# Patient Record
Sex: Female | Born: 1960 | Race: White | Hispanic: No | Marital: Single | Smoking: Current every day smoker
Health system: Southern US, Community
[De-identification: ages and names within clinical notes are randomized; demographics above are authoritative.]

## PROBLEM LIST (undated history)

## (undated) DIAGNOSIS — F32A Depression, unspecified: Secondary | ICD-10-CM

## (undated) DIAGNOSIS — B009 Herpesviral infection, unspecified: Secondary | ICD-10-CM

## (undated) DIAGNOSIS — M1990 Unspecified osteoarthritis, unspecified site: Secondary | ICD-10-CM

## (undated) HISTORY — DX: Herpesviral infection, unspecified: B00.9

## (undated) HISTORY — PX: OVARIAN CYST SURGERY: SHX726

## (undated) HISTORY — DX: Depression, unspecified: F32.A

## (undated) HISTORY — PX: DIAGNOSTIC LAPAROSCOPY: SUR761

## (undated) HISTORY — DX: Unspecified osteoarthritis, unspecified site: M19.90

---

## 2010-09-14 ENCOUNTER — Emergency Department: Payer: Self-pay | Admitting: Emergency Medicine

## 2019-01-19 ENCOUNTER — Inpatient Hospital Stay
Admit: 2019-01-19 | Discharge: 2019-01-19 | Disposition: A | Discharge status: Home / Self Care | Payer: BLUE CROSS/BLUE SHIELD | Attending: Emergency Medicine

## 2019-01-19 ENCOUNTER — Emergency Department: Admit: 2019-01-19 | Payer: BLUE CROSS/BLUE SHIELD

## 2019-01-19 DIAGNOSIS — S40012A Contusion of left shoulder, initial encounter: Secondary | ICD-10-CM
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
## 2019-01-19 NOTE — ED Notes (Signed)
Pt not discharged by this writer.
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
## 2019-01-19 NOTE — ED Notes (Signed)
Pt not discharged by this Clinical research associate.
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
## 2019-07-26 ENCOUNTER — Ambulatory Visit: Payer: Self-pay | Attending: Internal Medicine

## 2019-07-26 DIAGNOSIS — Z23 Encounter for immunization: Secondary | ICD-10-CM

## 2019-07-26 NOTE — Progress Notes (Signed)
   Covid-19 Vaccination Clinic  Name:  Amanda Krause    MRN: [id number] DOB: [birth year]  07/26/2019  [name] was observed post Covid-19 immunization for 15 minutes without incident. She was provided with Vaccine Information Sheet and instruction to access the V-Safe system.   [name] was instructed to call 911 with any severe reactions post vaccine: Marland Kitchen Difficulty breathing  . Swelling of face and throat  . A fast heartbeat  . A bad rash all over body  . Dizziness and weakness   Immunizations Administered    Name Date Dose VIS Date Route   Pfizer COVID-19 Vaccine 07/26/2019  3:03 PM 0.3 mL 04/02/2019 Intramuscular   Manufacturer: [place]   Lot: [id number]   [place]: [id number]

## 2019-08-18 ENCOUNTER — Ambulatory Visit: Payer: Self-pay | Attending: Internal Medicine

## 2019-08-18 DIAGNOSIS — Z23 Encounter for immunization: Secondary | ICD-10-CM

## 2019-08-18 NOTE — Progress Notes (Signed)
   Covid-19 Vaccination Clinic  Name:  Amanda Krause    MRN: [id number] DOB: 02-Feb-1961  08/18/2019  [name] was observed post Covid-19 immunization for 15 minutes without incident. She was provided with Vaccine Information Sheet and instruction to access the V-Safe system.   [name] was instructed to call 911 with any severe reactions post vaccine: Marland Kitchen Difficulty breathing  . Swelling of face and throat  . A fast heartbeat  . A bad rash all over body  . Dizziness and weakness   Immunizations Administered    Name Date Dose VIS Date Route   Pfizer COVID-19 Vaccine 08/18/2019  3:32 PM 0.3 mL 06/16/2018 Intramuscular   Manufacturer: [place]   Lot: [id number]   [place]: [id number]

## 2021-01-31 NOTE — Progress Notes (Signed)
New Patient Office Visit  Subjective:  Patient ID: Amanda Krause, female    DOB: 12-01-1960  Age: 60 y.o. MRN: [id number]  CC:  Chief Complaint  Patient presents with   New Patient (Initial Visit)    Here to Re-establish    HPI Amanda Krause presents for new patient visit to establish care.  Introduced to Designer, jewellery role and practice setting.  All questions answered.  Discussed provider/patient relationship and expectations.  She states that since her mom had colon cancer she has been under more stress and her depression is getting worse. She was on medications in the past including topamax, lamictal, prozac, wellbutrin, zoloft, and buspar. She felt like she was doing better and could manage her symptoms with meditation and breathing exercises. However, her stress has increased recently and feels like she may need medications again.   ANXIETY/STRESS  Duration:exacerbated Anxious mood: yes  Excessive worrying: yes Irritability: yes  Sweating: no Nausea: no Palpitations:yes Hyperventilation: yes Panic attacks: yes Agoraphobia: no  Obscessions/compulsions: no Depressed mood: yes Depression screen PHQ 2/9 02/01/2021  Decreased Interest 3  Down, Depressed, Hopeless 3  PHQ - 2 Score 6  Altered sleeping 3  Tired, decreased energy 3  Change in appetite 0  Feeling bad or failure about yourself  2  Trouble concentrating 2  Moving slowly or fidgety/restless 2  Suicidal thoughts 2  PHQ-9 Score 20  Difficult doing work/chores Somewhat difficult   GAD 7 : Generalized Anxiety Score 02/01/2021  Nervous, Anxious, on Edge 2  Control/stop worrying 3  Worry too much - different things 3  Trouble relaxing 0  Restless 0  Easily annoyed or irritable 3  Afraid - awful might happen 1  Total GAD 7 Score 12  Anxiety Difficulty Somewhat difficult   Anhedonia: no Weight changes: no Insomnia: yes hard to fall asleep - chronic Hypersomnia: no Fatigue/loss of energy: yes Feelings of  worthlessness: yes Feelings of guilt: yes Impaired concentration/indecisiveness: no Suicidal ideations:  had thoughts of being better off dead, but states she would never do it and doesn't have a plan. She states it's the most selfish thing you can do to your family and friends.   Crying spells: yes Recent Stressors/Life Changes: yes   Relationship problems: no   Family stress: yes     Financial stress: no    Job stress: no    Recent death/loss: no  HERPES  Chronic, with intermittent flares. States that it started several years ago and the lesion was tested and positive for herpes. She does not remember if it was type 1 or type 2. Takes acyclovir prn and is asking for a refill. Her last outbreak was 4 months ago.   Past Medical History:  Diagnosis Date   Arthritis    Depression    Herpes     Past Surgical History:  Procedure Laterality Date   OVARIAN CYST SURGERY      Family History  Problem Relation Age of Onset   Colon cancer Mother    Heart disease Father     Social History   Socioeconomic History   Marital status: Single    Spouse name: Not on file   Number of children: Not on file   Years of education: Not on file   Highest education level: Not on file  Occupational History   Not on file  Tobacco Use   Smoking status: Every Day    Types: Cigarettes   Smokeless tobacco: Never  Vaping  Use   Vaping Use: Never used  Substance and Sexual Activity   Alcohol use: Yes    Alcohol/week: 8.0 standard drinks    Types: 8 Cans of beer per week   Drug use: Never   Sexual activity: Not Currently  Other Topics Concern   Not on file  Social History Narrative   Not on file   Social Determinants of Health   Financial Resource Strain: Not on file  Food Insecurity: Not on file  Transportation Needs: Not on file  Physical Activity: Not on file  Stress: Not on file  Social Connections: Not on file  Intimate Partner Violence: Not on file    ROS Review of Systems   Constitutional:  Positive for fatigue.  HENT: Negative.    Eyes: Negative.   Respiratory: Negative.    Cardiovascular: Negative.   Gastrointestinal: Negative.   Endocrine: Negative.   Genitourinary: Negative.   Musculoskeletal:  Positive for arthralgias (hands, chronic).  Skin: Negative.   Neurological: Negative.   Psychiatric/Behavioral:  The patient is nervous/anxious.    Objective:   Today's Vitals: BP (!) 152/80   Pulse 92   Temp 97.8 F (36.6 C) (Oral)   Ht 4' 9.09" (1.45 m)   Wt 114 lb 6.4 oz (51.9 kg)   SpO2 98%   BMI 24.68 kg/m   Physical Exam Vitals and nursing note reviewed.  Constitutional:      General: She is not in acute distress.    Appearance: Normal appearance.  HENT:     Head: Normocephalic.  Eyes:     Conjunctiva/sclera: Conjunctivae normal.  Cardiovascular:     Rate and Rhythm: Normal rate and regular rhythm.     Pulses: Normal pulses.     Heart sounds: Normal heart sounds.  Pulmonary:     Effort: Pulmonary effort is normal.     Breath sounds: Normal breath sounds.  Abdominal:     Palpations: Abdomen is soft.     Tenderness: There is no abdominal tenderness.  Musculoskeletal:     Cervical back: Normal range of motion.  Skin:    General: Skin is warm.  Neurological:     General: No focal deficit present.     Mental Status: She is alert and oriented to person, place, and time.  Psychiatric:        Mood and Affect: Mood normal.        Behavior: Behavior normal.        Thought Content: Thought content normal.        Judgment: Judgment normal.    Assessment & Plan:   Problem List Items Addressed This Visit       Other   Tobacco use    Currently smoking a little less than 1ppd for the last 9 years. She had stopped smoking for 18 years before that. She is interested in quitting today and would like to use the nicotine patch. Will send in 21mg  nicoderm patch to the pharmacy. Discussed not smoking while using the patch. Follow up in 4-6  weeks.       Herpes    No outbreaks currently, but has them intermittently. Will send in a prescription for valacyclovir prn. Follow up if having more frequent outbreaks or for any concerns.       Relevant Medications   valACYclovir (VALTREX) 1000 MG tablet   Depression, major, single episode, moderate (HCC) - Primary    Chronic, exacerbated with recent stress with mother's illness. Her PHQ-9 is 20 and  GAD 7 is 12. She has taken medications in the past, including wellbutrin, prozac, topamax, lamictal, zoloft, and buspar, however she did not find them helpful and tapered off. She had been doing well with meditation, exercise, and breathing exercises, however she would like to start medication again. Will start lexapro 10mg  daily. She has had thoughts that she may be better off dead off and on for many years. She does not have a plan and states that she would not do that to her family and friends as it is the most selfish thing someone can do. Educated and discussed that starting lexapro or any anti-depressant has the potential for suicidal ideation. Call office or 911 immediately if these symptoms develop. Follow-up in 4-6 weeks.       Relevant Medications   escitalopram (LEXAPRO) 10 MG tablet   Elevated blood pressure reading in office without diagnosis of hypertension    Repeat blood pressure in office today 157/82. She states that she has not had elevated blood pressures in the past. Discussed low salt diet and exercise. Encouraged her to check her blood pressure at home to see if readings are elevated only in the doctor's office. Follow up in 4-6 weeks.       Other Visit Diagnoses     Encounter to establish care       Encounter for screening mammogram for malignant neoplasm of breast       Mammogram ordered today   Relevant Orders   MM 3D [place] for colon cancer       Referral placed to GI for colonoscopy. Mother has history of colon cancer.   Relevant Orders    Ambulatory referral to Gastroenterology   Need for tetanus booster       Td given today   Relevant Orders   Td : Tetanus/diphtheria >7yo Preservative  free (Completed)   Need for shingles vaccine       First Shingrix vaccine given today   Relevant Orders   Varicella-zoster vaccine IM (Shingrix) (Completed)       Outpatient Encounter Medications as of 02/01/2021  Medication Sig   cholecalciferol (VITAMIN D3) 25 MCG (1000 UNIT) tablet Take 1,000 Units by mouth daily.   escitalopram (LEXAPRO) 10 MG tablet Take 1 tablet (10 mg total) by mouth daily.   nicotine (NICODERM CQ) 21 mg/24hr patch Place 1 patch (21 mg total) onto the skin daily.   valACYclovir (VALTREX) 1000 MG tablet Take 1 tablet (1,000 mg total) by mouth 2 (two) times daily as needed for up to 10 days. Take 2 tablets every 12 hours for 1 day at first sign of outbreak   vitamin B-12 (CYANOCOBALAMIN) 100 MCG tablet Take 100 mcg by mouth daily.   No facility-administered encounter medications on file as of 02/01/2021.    Follow-up: Return in about 4 weeks (around 03/01/2021) for 4-6 weeks for anxiety, depression .   Charyl Dancer, NP

## 2021-02-01 ENCOUNTER — Encounter: Payer: Self-pay | Admitting: Nurse Practitioner

## 2021-02-01 ENCOUNTER — Ambulatory Visit (INDEPENDENT_AMBULATORY_CARE_PROVIDER_SITE_OTHER): Payer: 59 | Admitting: Nurse Practitioner

## 2021-02-01 ENCOUNTER — Other Ambulatory Visit: Payer: Self-pay

## 2021-02-01 VITALS — BP 152/80 | HR 92 | Temp 97.8°F | Ht <= 58 in | Wt 114.4 lb

## 2021-02-01 DIAGNOSIS — Z7689 Persons encountering health services in other specified circumstances: Secondary | ICD-10-CM

## 2021-02-01 DIAGNOSIS — Z23 Encounter for immunization: Secondary | ICD-10-CM | POA: Diagnosis not present

## 2021-02-01 DIAGNOSIS — F321 Major depressive disorder, single episode, moderate: Secondary | ICD-10-CM | POA: Diagnosis not present

## 2021-02-01 DIAGNOSIS — B009 Herpesviral infection, unspecified: Secondary | ICD-10-CM

## 2021-02-01 DIAGNOSIS — Z1231 Encounter for screening mammogram for malignant neoplasm of breast: Secondary | ICD-10-CM

## 2021-02-01 DIAGNOSIS — R030 Elevated blood-pressure reading, without diagnosis of hypertension: Secondary | ICD-10-CM | POA: Insufficient documentation

## 2021-02-01 DIAGNOSIS — Z720 Tobacco use: Secondary | ICD-10-CM

## 2021-02-01 DIAGNOSIS — Z1211 Encounter for screening for malignant neoplasm of colon: Secondary | ICD-10-CM | POA: Diagnosis not present

## 2021-02-01 MED ORDER — ESCITALOPRAM OXALATE 10 MG PO TABS: 10.0000 mg | ORAL_TABLET | Freq: Every day | ORAL | 1 refills | Status: DC

## 2021-02-01 MED ORDER — NICOTINE 21 MG/24HR TD PT24: 21.0000 mg | MEDICATED_PATCH | Freq: Every day | TRANSDERMAL | 1 refills | Status: DC

## 2021-02-01 MED ORDER — VALACYCLOVIR HCL 1 G PO TABS: 1000.0000 mg | ORAL_TABLET | Freq: Two times a day (BID) | ORAL | 0 refills | Status: AC | PRN

## 2021-02-01 NOTE — Assessment & Plan Note (Signed)
Repeat blood pressure in office today 157/82. She states that she has not had elevated blood pressures in the past. Discussed low salt diet and exercise. Encouraged her to check her blood pressure at home to see if readings are elevated only in the doctor's office. Follow up in 4-6 weeks.

## 2021-02-01 NOTE — Assessment & Plan Note (Signed)
Currently smoking a little less than 1ppd for the last 9 years. She had stopped smoking for 18 years before that. She is interested in quitting today and would like to use the nicotine patch. Will send in 21mg  nicoderm patch to the pharmacy. Discussed not smoking while using the patch. Follow up in 4-6 weeks.

## 2021-02-01 NOTE — Assessment & Plan Note (Signed)
No outbreaks currently, but has them intermittently. Will send in a prescription for valacyclovir prn. Follow up if having more frequent outbreaks or for any concerns.

## 2021-02-01 NOTE — Assessment & Plan Note (Signed)
Chronic, exacerbated with recent stress with mother's illness. Her PHQ-9 is 20 and GAD 7 is 12. She has taken medications in the past, including wellbutrin, prozac, topamax, lamictal, zoloft, and buspar, however she did not find them helpful and tapered off. She had been doing well with meditation, exercise, and breathing exercises, however she would like to start medication again. Will start lexapro 10mg  daily. She has had thoughts that she may be better off dead off and on for many years. She does not have a plan and states that she would not do that to her family and friends as it is the most selfish thing someone can do. Educated and discussed that starting lexapro or any anti-depressant has the potential for suicidal ideation. Call office or 911 immediately if these symptoms develop. Follow-up in 4-6 weeks.

## 2021-02-01 NOTE — Patient Instructions (Signed)
[facility]  Address: [address], [place], [place] [address]  Phone: [phone]  

## 2021-02-15 ENCOUNTER — Telehealth: Payer: Self-pay

## 2021-02-15 NOTE — Telephone Encounter (Signed)
Patients referral is in the workque.

## 2021-02-15 NOTE — Telephone Encounter (Signed)
Pt. Calling to schedule colonoscopy 

## 2021-02-27 ENCOUNTER — Telehealth: Payer: Self-pay

## 2021-02-27 ENCOUNTER — Other Ambulatory Visit: Payer: Self-pay

## 2021-02-27 MED ORDER — CLENPIQ 10-3.5-12 MG-GM -GM/160ML PO SOLN: 320.0000 mL | ORAL | 0 refills | Status: DC

## 2021-02-27 NOTE — Telephone Encounter (Signed)
Gastroenterology Pre-Procedure Review  Request Date: 03/20/21 Requesting Physician: Dr. Allen Norris  PATIENT REVIEW QUESTIONS: The patient responded to the following health history questions as indicated:    1. Are you having any GI issues? no 2. Do you have a personal history of Polyps? no 3. Do you have a family history of Colon Cancer or Polyps? yes (mother & Uncle - cancer) 4. Diabetes Mellitus? No 5. Joint replacements in the past 12 months?no 6. Major health problems in the past 3 months?no 7. Any artificial heart valves, MVP, or defibrillator?no    MEDICATIONS & ALLERGIES:    Patient reports the following regarding taking any anticoagulation/antiplatelet therapy:   Plavix, Coumadin, Eliquis, Xarelto, Lovenox, Pradaxa, Brilinta, or Effient? no Aspirin? no  Patient confirms/reports the following medications:  Current Outpatient Medications  Medication Sig Dispense Refill   cholecalciferol (VITAMIN D3) 25 MCG (1000 UNIT) tablet Take 1,000 Units by mouth daily.     escitalopram (LEXAPRO) 10 MG tablet Take 1 tablet (10 mg total) by mouth daily. 30 tablet 1   nicotine (NICODERM CQ) 21 mg/24hr patch Place 1 patch (21 mg total) onto the skin daily. 28 patch 1   vitamin B-12 (CYANOCOBALAMIN) 100 MCG tablet Take 100 mcg by mouth daily.     No current facility-administered medications for this visit.    Patient confirms/reports the following allergies:  No Known Allergies  No orders of the defined types were placed in this encounter.   AUTHORIZATION INFORMATION Primary Insurance: 1D#: Group #:  Secondary Insurance: 1D#: Group #:  SCHEDULE INFORMATION: Date:  Time: Location:

## 2021-03-07 NOTE — Progress Notes (Signed)
Established Patient Office Visit  Subjective:  Patient ID: Amanda Krause, female    DOB: 1960/07/03  Age: 60 y.o. MRN: [id number]  CC:  Chief Complaint  Patient presents with   Depression    FU for depression and new med. Still very sad, anxious and depressed.    HPI Amanda Krause presents for follow up on depression and anxiety. She was recently started on lexapro and states that it helped her irritability, but she is still feeling sad and depressed. She also states that she started having urinary urgency. She states if she doesn't go to the bathroom right away, she may not make it. She is not sure if this is a side effect of the lexapro since it started when she started taking it.   DEPRESSION  Mood status: better Satisfied with current treatment?:  Lexapro helped, but not completely better Symptom severity: moderate  Duration of current treatment : months Side effects: yes Medication compliance: excellent compliance Psychotherapy/counseling: no  Previous psychiatric medications: lexapro Depressed mood: yes Anxious mood: no Anhedonia: no Significant weight loss or gain: no Insomnia: yes hard to stay asleep Fatigue: yes Feelings of worthlessness or guilt: yes Impaired concentration/indecisiveness: yes Suicidal ideations: no Hopelessness: yes Crying spells: no Depression screen [facility] 2/9 03/08/2021 02/01/2021  Decreased Interest 2 3  Down, Depressed, Hopeless 2 3  PHQ - 2 Score 4 6  Altered sleeping 2 3  Tired, decreased energy 2 3  Change in appetite 0 0  Feeling bad or failure about yourself  2 2  Trouble concentrating 1 2  Moving slowly or fidgety/restless 0 2  Suicidal thoughts 1 2  PHQ-9 Score 12 20  Difficult doing work/chores - Somewhat difficult   GAD 7 : Generalized Anxiety Score 03/08/2021 02/01/2021  Nervous, Anxious, on Edge 3 2  Control/stop worrying 2 3  Worry too much - different things 2 3  Trouble relaxing 2 0  Restless 1 0  Easily annoyed or  irritable 2 3  Afraid - awful might happen 1 1  Total GAD 7 Score 13 12  Anxiety Difficulty Somewhat difficult Somewhat difficult    Past Medical History:  Diagnosis Date   Arthritis    Depression    Herpes     Past Surgical History:  Procedure Laterality Date   OVARIAN CYST SURGERY      Family History  Problem Relation Age of Onset   Colon cancer Mother    Heart disease Father     Social History   Socioeconomic History   Marital status: Single    Spouse name: Not on file   Number of children: Not on file   Years of education: Not on file   Highest education level: Not on file  Occupational History   Not on file  Tobacco Use   Smoking status: Every Day    Types: Cigarettes   Smokeless tobacco: Never  Vaping Use   Vaping Use: Never used  Substance and Sexual Activity   Alcohol use: Yes    Alcohol/week: 8.0 standard drinks    Types: 8 Cans of beer per week   Drug use: Never   Sexual activity: Not Currently  Other Topics Concern   Not on file  Social History Narrative   Not on file   Social Determinants of Health   Financial Resource Strain: Not on file  Food Insecurity: Not on file  Transportation Needs: Not on file  Physical Activity: Not on file  Stress: Not on  file  Social Connections: Not on file  Intimate Partner Violence: Not on file    Outpatient Medications Prior to Visit  Medication Sig Dispense Refill   cholecalciferol (VITAMIN D3) 25 MCG (1000 UNIT) tablet Take 1,000 Units by mouth daily.     Sod Picosulfate-Mag Ox-Cit Acd (CLENPIQ) 10-3.5-12 MG-GM -GM/160ML SOLN Take 320 mLs by mouth as directed. 320 mL 0   vitamin B-12 (CYANOCOBALAMIN) 100 MCG tablet Take 100 mcg by mouth daily.     escitalopram (LEXAPRO) 10 MG tablet Take 1 tablet (10 mg total) by mouth daily. 30 tablet 1   nicotine (NICODERM CQ) 21 mg/24hr patch Place 1 patch (21 mg total) onto the skin daily. (Patient not taking: Reported on 03/08/2021) 28 patch 1   No  facility-administered medications prior to visit.    No Known Allergies  ROS Review of Systems  Constitutional: Negative.   Respiratory: Negative.    Cardiovascular: Negative.   Gastrointestinal: Negative.   Genitourinary: Negative.   Musculoskeletal: Negative.   Skin: Negative.   Psychiatric/Behavioral:  Positive for dysphoric mood. The patient is nervous/anxious.      Objective:    Physical Exam Vitals and nursing note reviewed.  Constitutional:      General: She is not in acute distress.    Appearance: Normal appearance.  HENT:     Head: Normocephalic and atraumatic.  Eyes:     Conjunctiva/sclera: Conjunctivae normal.  Cardiovascular:     Rate and Rhythm: Normal rate and regular rhythm.     Pulses: Normal pulses.     Heart sounds: Normal heart sounds.  Pulmonary:     Effort: Pulmonary effort is normal.     Breath sounds: Normal breath sounds.  Musculoskeletal:     Cervical back: Normal range of motion.  Skin:    General: Skin is warm and dry.  Neurological:     General: No focal deficit present.     Mental Status: She is alert and oriented to person, place, and time.  Psychiatric:        Mood and Affect: Mood normal.        Behavior: Behavior normal.        Thought Content: Thought content normal.        Judgment: Judgment normal.    BP (!) 142/85   Pulse 73   Temp 98.4 F (36.9 C) (Oral)   Ht 4' 10"  (1.473 m)   Wt 115 lb (52.2 kg)   SpO2 97%   BMI 24.04 kg/m  Wt Readings from Last 3 Encounters:  03/08/21 115 lb (52.2 kg)  02/01/21 114 lb 6.4 oz (51.9 kg)     Health Maintenance Due  Topic Date Due   Pneumococcal Vaccine 44-58 Years old (1 - PCV) Never done   HIV Screening  Never done   Hepatitis C Screening  Never done   PAP SMEAR-Modifier  Never done   COLONOSCOPY (Pts 45-72yr Insurance coverage will need to be confirmed)  Never done   MAMMOGRAM  Never done   COVID-19 Vaccine (3 - [place]oster for [place]) 10/13/2019   INFLUENZA VACCINE   Never done    There are no preventive care reminders to display for this patient.  No results found for: TSH No results found for: WBC, HGB, HCT, MCV, PLT No results found for: NA, K, CHLORIDE, CO2, GLUCOSE, BUN, CREATININE, BILITOT, ALKPHOS, AST, ALT, PROT, ALBUMIN, CALCIUM, ANIONGAP, EGFR, GFR No results found for: CHOL No results found for: HDL No results found for:  [place] No results found for: TRIG No results found for: CHOLHDL No results found for: HGBA1C    Assessment & Plan:   Problem List Items Addressed This Visit       Other   Depression, major, single episode, moderate ([place]) - Primary    Chronic, not controlled. Symptoms have improved since starting lexapro, however may be experiencing some side effects. Will have her wean off lexapro and start seroquel at bedtime. Currently denies SI/HI. Follow up in 4-6 weeks.       Elevated blood pressure reading in office without diagnosis of hypertension    Blood pressure improved from last visit, however still elevated. She is not interested in starting any medications today. Discussed DASH diet. Encouraged her to get a blood pressure cuff and monitor her blood pressure at home.        Meds ordered this encounter  Medications   QUEtiapine (SEROQUEL) 25 MG tablet    Sig: Take 1 tablet (25 mg total) by mouth at bedtime.    Dispense:  30 tablet    Refill:  1     Follow-up: Return in about 4 weeks (around 04/05/2021) for physical.    Charyl Dancer, NP

## 2021-03-08 ENCOUNTER — Encounter: Payer: Self-pay | Admitting: Nurse Practitioner

## 2021-03-08 ENCOUNTER — Ambulatory Visit (INDEPENDENT_AMBULATORY_CARE_PROVIDER_SITE_OTHER): Payer: 59 | Admitting: Nurse Practitioner

## 2021-03-08 ENCOUNTER — Other Ambulatory Visit: Payer: Self-pay

## 2021-03-08 VITALS — BP 142/85 | HR 73 | Temp 98.4°F | Ht <= 58 in | Wt 115.0 lb

## 2021-03-08 DIAGNOSIS — F321 Major depressive disorder, single episode, moderate: Secondary | ICD-10-CM | POA: Diagnosis not present

## 2021-03-08 DIAGNOSIS — R030 Elevated blood-pressure reading, without diagnosis of hypertension: Secondary | ICD-10-CM

## 2021-03-08 MED ORDER — QUETIAPINE FUMARATE 25 MG PO TABS: 25.0000 mg | ORAL_TABLET | Freq: Every day | ORAL | 1 refills | Status: DC

## 2021-03-08 NOTE — Patient Instructions (Addendum)
Take 1 lexapro tablet tomorrow, and then 1 on Sunday, and then stop Start seroquel 1 tablet at bedtime every night starting tonight Watch the amount of salt you are eating in your foods  Get a blood pressure cuff and start checking your blood pressure daily and write it down

## 2021-03-08 NOTE — Assessment & Plan Note (Signed)
Chronic, not controlled. Symptoms have improved since starting lexapro, however may be experiencing some side effects. Will have her wean off lexapro and start seroquel at bedtime. Currently denies SI/HI. Follow up in 4-6 weeks.

## 2021-03-08 NOTE — Assessment & Plan Note (Signed)
Blood pressure improved from last visit, however still elevated. She is not interested in starting any medications today. Discussed DASH diet. Encouraged her to get a blood pressure cuff and monitor her blood pressure at home.

## 2021-03-12 ENCOUNTER — Encounter: Payer: Self-pay | Admitting: Nurse Practitioner

## 2021-03-12 MED ORDER — ARIPIPRAZOLE 2 MG PO TABS: 2.0000 mg | ORAL_TABLET | Freq: Every day | ORAL | 1 refills | Status: DC

## 2021-03-12 MED ORDER — ESCITALOPRAM OXALATE 10 MG PO TABS: 10.0000 mg | ORAL_TABLET | Freq: Every day | ORAL | 1 refills | Status: DC

## 2021-03-19 ENCOUNTER — Telehealth: Payer: Self-pay | Admitting: Gastroenterology

## 2021-03-19 ENCOUNTER — Encounter: Payer: Self-pay | Admitting: *Deleted

## 2021-03-19 NOTE — Telephone Encounter (Signed)
LVM for patient to inform that her insurance denied full coverage..Patient returned call and stated that she was already contacted by [facility] and has a $2800 deductible and will set up payment arrangements with [facility]. I did inform patient that she could contact her referring Doctor to get referred to a Free standing [place], so the insurance could cover the procedure. Patient wanted to continue with the scheduled procedure and set up payment arrangement with [facility].

## 2021-03-20 ENCOUNTER — Ambulatory Visit: Payer: 59 | Admitting: Registered Nurse

## 2021-03-20 ENCOUNTER — Encounter
Admission: RE | Disposition: A | Discharge status: Home / Self Care | Payer: Self-pay | Source: Ambulatory Visit | Attending: Gastroenterology

## 2021-03-20 ENCOUNTER — Other Ambulatory Visit: Payer: Self-pay

## 2021-03-20 ENCOUNTER — Ambulatory Visit
Admission: RE | Admit: 2021-03-20 | Discharge: 2021-03-20 | Disposition: A | Discharge status: Home / Self Care | Payer: 59 | Source: Ambulatory Visit | Attending: Gastroenterology | Admitting: Gastroenterology

## 2021-03-20 ENCOUNTER — Encounter: Payer: Self-pay | Admitting: Gastroenterology

## 2021-03-20 DIAGNOSIS — F17210 Nicotine dependence, cigarettes, uncomplicated: Secondary | ICD-10-CM | POA: Diagnosis not present

## 2021-03-20 DIAGNOSIS — D124 Benign neoplasm of descending colon: Secondary | ICD-10-CM | POA: Insufficient documentation

## 2021-03-20 DIAGNOSIS — Z1211 Encounter for screening for malignant neoplasm of colon: Secondary | ICD-10-CM | POA: Diagnosis present

## 2021-03-20 DIAGNOSIS — D123 Benign neoplasm of transverse colon: Secondary | ICD-10-CM | POA: Diagnosis not present

## 2021-03-20 DIAGNOSIS — D125 Benign neoplasm of sigmoid colon: Secondary | ICD-10-CM | POA: Insufficient documentation

## 2021-03-20 DIAGNOSIS — K635 Polyp of colon: Secondary | ICD-10-CM | POA: Diagnosis not present

## 2021-03-20 HISTORY — PX: COLONOSCOPY: SHX5424

## 2021-03-20 SURGERY — COLONOSCOPY
Anesthesia: General

## 2021-03-20 MED ORDER — PROPOFOL 500 MG/50ML IV EMUL
INTRAVENOUS | Status: AC
  Filled 2021-03-20: qty 50

## 2021-03-20 MED ORDER — SODIUM CHLORIDE 0.9 % IV SOLN: INTRAVENOUS | Status: DC

## 2021-03-20 MED ORDER — PROPOFOL 500 MG/50ML IV EMUL
INTRAVENOUS | Status: DC | PRN
  Administered 2021-03-20: 150 ug/kg/min via INTRAVENOUS

## 2021-03-20 MED ORDER — PROPOFOL 10 MG/ML IV BOLUS
INTRAVENOUS | Status: DC | PRN
  Administered 2021-03-20 (×3): 10 mg via INTRAVENOUS
  Administered 2021-03-20: 80 mg via INTRAVENOUS

## 2021-03-20 MED ORDER — DEXMEDETOMIDINE (PRECEDEX) IN NS 20 MCG/5ML (4 MCG/ML) IV SYRINGE
PREFILLED_SYRINGE | INTRAVENOUS | Status: DC | PRN
  Administered 2021-03-20: 12 ug via INTRAVENOUS
  Administered 2021-03-20: 8 ug via INTRAVENOUS

## 2021-03-20 MED ORDER — LIDOCAINE HCL (CARDIAC) PF 100 MG/5ML IV SOSY
PREFILLED_SYRINGE | INTRAVENOUS | Status: DC | PRN
  Administered 2021-03-20: 40 mg via INTRAVENOUS

## 2021-03-20 NOTE — Transfer of Care (Signed)
Immediate Anesthesia Transfer of Care Note  Patient: Amanda Krause  Procedure(s) Performed: Procedure(s): COLONOSCOPY (N/A)  Patient Location: PACU and Endoscopy Unit  Anesthesia Type:General  Level of Consciousness: sedated  Airway & Oxygen Therapy: Patient Spontanous Breathing and Patient connected to nasal cannula oxygen  Post-op Assessment: Report given to RN and Post -op Vital signs reviewed and stable  Post vital signs: Reviewed and stable  Last Vitals:  Vitals:   03/20/21 1029 03/20/21 1031  BP: 120/69 120/69  Pulse: 71 77  Resp: 15 17  Temp: 36.6 C   SpO2: 93% 968%    Complications: No apparent anesthesia complications

## 2021-03-20 NOTE — Anesthesia Postprocedure Evaluation (Signed)
Anesthesia Post Note  Patient: Amanda Krause  Procedure(s) Performed: COLONOSCOPY  Patient location during evaluation: PACU Anesthesia Type: General Level of consciousness: awake and alert Pain management: pain level controlled Vital Signs Assessment: post-procedure vital signs reviewed and stable Respiratory status: spontaneous breathing, nonlabored ventilation, respiratory function stable and patient connected to nasal cannula oxygen Cardiovascular status: blood pressure returned to baseline and stable Postop Assessment: no apparent nausea or vomiting Anesthetic complications: no   No notable events documented.   Last Vitals:  Vitals:   03/20/21 1031 03/20/21 1049  BP: 120/69 (!) 143/74  Pulse: 77   Resp: 17   Temp:    SpO2: 100% 99%    Last Pain:  Vitals:   03/20/21 1049  TempSrc:   PainSc: 0-No pain                 Nisa Decaire M Calena [place]

## 2021-03-20 NOTE — Anesthesia Procedure Notes (Signed)
Date/Time: 03/20/2021 10:04 AM Performed by: Doreen Salvage, CRNA Pre-anesthesia Checklist: Patient identified, Emergency Drugs available, Suction available and Patient being monitored Patient Re-evaluated:Patient Re-evaluated prior to induction Oxygen Delivery Method: Nasal cannula Induction Type: IV induction Dental Injury: Teeth and Oropharynx as per pre-operative assessment  Comments: Nasal cannula with etCO2 monitoring

## 2021-03-20 NOTE — Anesthesia Preprocedure Evaluation (Signed)
Anesthesia Evaluation  Patient identified by MRN, date of birth, ID band Patient awake    Reviewed: Allergy & Precautions, H&P , NPO status , Patient's Chart, lab work & pertinent test results, reviewed documented beta blocker date and time   Airway Mallampati: II   Neck ROM: full    Dental  (+) Poor Dentition   Pulmonary neg pulmonary ROS, Current Smoker,    Pulmonary exam normal        Cardiovascular Exercise Tolerance: Good negative cardio ROS Normal cardiovascular exam Rhythm:regular Rate:Normal     Neuro/Psych PSYCHIATRIC DISORDERS Depression negative neurological ROS     GI/Hepatic negative GI ROS, Neg liver ROS,   Endo/Other  negative endocrine ROS  Renal/GU negative Renal ROS  negative genitourinary   Musculoskeletal   Abdominal   Peds  Hematology negative hematology ROS (+)   Anesthesia Other Findings Past Medical History: No date: Arthritis No date: Depression No date: Herpes Past Surgical History: No date: DIAGNOSTIC LAPAROSCOPY No date: OVARIAN CYST SURGERY BMI    Body Mass Index: 24.89 kg/m     Reproductive/Obstetrics negative OB ROS                             Anesthesia Physical Anesthesia Plan  ASA: 2  Anesthesia Plan: General   Post-op Pain Management:    Induction:   PONV Risk Score and Plan:   Airway Management Planned:   Additional Equipment:   Intra-op Plan:   Post-operative Plan:   Informed Consent: I have reviewed the patients History and Physical, chart, labs and discussed the procedure including the risks, benefits and alternatives for the proposed anesthesia with the patient or authorized representative who has indicated his/her understanding and acceptance.     Dental Advisory Given  Plan Discussed with: CRNA  Anesthesia Plan Comments:         Anesthesia Quick Evaluation

## 2021-03-20 NOTE — Op Note (Signed)
[facility] Gastroenterology Patient Name: Amanda Krause Procedure Date: 03/20/2021 10:04 AM MRN: [id number] Account #: [id number] Date of Birth: 1960/12/18 Admit Type: Outpatient Age: 60 Room: [facility] ENDO ROOM 4 Gender: Female Note Status: Finalized Instrument Name: Jasper Riling [id number] Procedure:             Colonoscopy Indications:           Screening for colorectal malignant neoplasm Providers:             Lucilla Lame MD, MD Referring MD:          Guadalupe Maple, MD (Referring MD) Medicines:             Propofol per Anesthesia Complications:         No immediate complications. Procedure:             Pre-Anesthesia Assessment:                        - Prior to the procedure, a History and Physical was                         performed, and patient medications and allergies were                         reviewed. The patient's tolerance of previous                         anesthesia was also reviewed. The risks and benefits                         of the procedure and the sedation options and risks                         were discussed with the patient. All questions were                         answered, and informed consent was obtained. Prior                         Anticoagulants: The patient has taken no previous                         anticoagulant or antiplatelet agents. ASA Grade                         Assessment: II - A patient with mild systemic disease.                         After reviewing the risks and benefits, the patient                         was deemed in satisfactory condition to undergo the                         procedure.                        After obtaining informed consent, the colonoscope was  passed under direct vision. Throughout the procedure,                         the patient's blood pressure, pulse, and oxygen                         saturations were monitored continuously. The                          Colonoscope was introduced through the anus and                         advanced to the the cecum, identified by appendiceal                         orifice and ileocecal valve. The colonoscopy was                         performed without difficulty. The patient tolerated                         the procedure well. The quality of the bowel                         preparation was excellent. Findings:      The perianal and digital rectal examinations were normal.      Two sessile polyps were found in the transverse colon. The polyps were 3       to 4 mm in size. These polyps were removed with a cold snare. Resection       and retrieval were complete.      A 4 mm polyp was found in the descending colon. The polyp was sessile.       The polyp was removed with a cold snare. Resection and retrieval were       complete.      A 9 mm polyp was found in the sigmoid colon. The polyp was pedunculated.       The polyp was removed with a hot snare. Resection and retrieval were       complete. Impression:            - Two 3 to 4 mm polyps in the transverse colon,                         removed with a cold snare. Resected and retrieved.                        - One 4 mm polyp in the descending colon, removed with                         a cold snare. Resected and retrieved.                        - One 9 mm polyp in the sigmoid colon, removed with a                         hot snare. Resected and retrieved. Recommendation:        - Discharge patient to home.                        -  Resume previous diet.                        - Continue present medications.                        - Await pathology results.                        - If the pathology report reveals adenomatous tissue,                         then repeat the colonoscopy for surveillance in 5                         years. Procedure Code(s):     --- Professional ---                        [phone], Colonoscopy, flexible; with removal of                          tumor(s), polyp(s), or other lesion(s) by snare                         technique Diagnosis Code(s):     --- Professional ---                        Z12.11, Encounter for screening for malignant neoplasm                         of colon                        K63.5, Polyp of colon CPT copyright 2019 [employer] Association. All rights reserved. The codes documented in this report are preliminary and upon coder review may  be revised to meet current compliance requirements. Lucilla Lame MD, MD 03/20/2021 10:26:42 AM This report has been signed electronically. Number of Addenda: 0 Note Initiated On: 03/20/2021 10:04 AM Scope Withdrawal Time: 0 hours 10 minutes 16 seconds  Total Procedure Duration: 0 hours 15 minutes 52 seconds  Estimated Blood Loss:  Estimated blood loss: none.      [facility]

## 2021-03-20 NOTE — H&P (Signed)
Lucilla Lame, MD [address] [place], [place] [address] Phone: [phone] Fax : [phone]  Primary Care Physician:  Amanda Dancer, NP Primary Gastroenterologist:  Dr. Allen Norris  Pre-Procedure History & Physical: HPI:  Amanda Krause is a 60 y.o. female is here for a screening colonoscopy.   Past Medical History:  Diagnosis Date   Arthritis    Depression    Herpes     Past Surgical History:  Procedure Laterality Date   DIAGNOSTIC LAPAROSCOPY     OVARIAN CYST SURGERY      Prior to Admission medications   Medication Sig Start Date End Date Taking? Authorizing Provider  ARIPiprazole (ABILIFY) 2 MG tablet Take 1 tablet (2 mg total) by mouth daily. 03/12/21  Yes McElwee, Lauren A, NP  cholecalciferol (VITAMIN D3) 25 MCG (1000 UNIT) tablet Take 1,000 Units by mouth daily.   Yes [provider]  escitalopram (LEXAPRO) 10 MG tablet Take 1 tablet (10 mg total) by mouth daily. 03/12/21  Yes McElwee, Lauren A, NP  vitamin B-12 (CYANOCOBALAMIN) 100 MCG tablet Take 100 mcg by mouth daily.   Yes [provider]  nicotine (NICODERM CQ) 21 mg/24hr patch Place 1 patch (21 mg total) onto the skin daily. Patient not taking: Reported on 03/08/2021 02/01/21   Amanda Dancer, NP  Sod Picosulfate-Mag Ox-Cit Acd (CLENPIQ) 10-3.5-12 MG-GM -GM/160ML SOLN Take 320 mLs by mouth as directed. 02/27/21   Lucilla Lame, MD    Allergies as of 02/27/2021   (No Known Allergies)    Family History  Problem Relation Age of Onset   Colon cancer Mother    Heart disease Father     Social History   Socioeconomic History   Marital status: Single    Spouse name: Not on file   Number of children: Not on file   Years of education: Not on file   Highest education level: Not on file  Occupational History   Not on file  Tobacco Use   Smoking status: Every Day    Types: Cigarettes   Smokeless tobacco: Never  Vaping Use   Vaping Use: Never used  Substance and Sexual  Activity   Alcohol use: Yes    Alcohol/week: 8.0 standard drinks    Types: 8 Cans of beer per week    Comment: Every night   Drug use: Never   Sexual activity: Not Currently  Other Topics Concern   Not on file  Social History Narrative   Not on file   Social Determinants of Health   Financial Resource Strain: Not on file  Food Insecurity: Not on file  Transportation Needs: Not on file  Physical Activity: Not on file  Stress: Not on file  Social Connections: Not on file  Intimate Partner Violence: Not on file    Review of Systems: See HPI, otherwise negative ROS  Physical Exam: BP (!) 170/78   Pulse 77   Temp 97.9 F (36.6 C) (Temporal)   Resp 14   Ht 4\' 9"  (1.448 m)   Wt 52.2 kg   SpO2 100%   BMI 24.89 kg/m  General:   Alert,  pleasant and cooperative in NAD Head:  Normocephalic and atraumatic. Neck:  Supple; no masses or thyromegaly. Lungs:  Clear throughout to auscultation.    Heart:  Regular rate and rhythm. Abdomen:  Soft, nontender and nondistended. Normal bowel sounds, without guarding, and without rebound.   Neurologic:  Alert and  oriented x4;  grossly normal neurologically.  Impression/Plan: Amanda Krause is now here to undergo a screening colonoscopy.  Risks, benefits, and alternatives regarding colonoscopy have been reviewed with the patient.  Questions have been answered.  All parties agreeable.

## 2021-03-21 ENCOUNTER — Encounter: Payer: Self-pay | Admitting: Gastroenterology

## 2021-03-21 LAB — SURGICAL PATHOLOGY

## 2021-03-22 ENCOUNTER — Encounter: Payer: Self-pay | Admitting: Gastroenterology

## 2021-04-05 ENCOUNTER — Encounter: Payer: 59 | Admitting: Nurse Practitioner

## 2021-04-05 DIAGNOSIS — F321 Major depressive disorder, single episode, moderate: Secondary | ICD-10-CM

## 2021-04-05 DIAGNOSIS — Z13220 Encounter for screening for lipoid disorders: Secondary | ICD-10-CM

## 2021-04-05 DIAGNOSIS — R030 Elevated blood-pressure reading, without diagnosis of hypertension: Secondary | ICD-10-CM

## 2021-04-05 DIAGNOSIS — Z114 Encounter for screening for human immunodeficiency virus [HIV]: Secondary | ICD-10-CM

## 2021-04-05 DIAGNOSIS — Z1159 Encounter for screening for other viral diseases: Secondary | ICD-10-CM

## 2021-04-05 DIAGNOSIS — Z0000 Encounter for general adult medical examination without abnormal findings: Secondary | ICD-10-CM

## 2021-04-05 DIAGNOSIS — Z720 Tobacco use: Secondary | ICD-10-CM

## 2021-04-09 ENCOUNTER — Other Ambulatory Visit: Payer: Self-pay | Admitting: Nurse Practitioner

## 2021-04-09 NOTE — Telephone Encounter (Signed)
Requested Prescriptions  Pending Prescriptions Disp Refills   escitalopram (LEXAPRO) 10 MG tablet [Pharmacy Med Name: ESCITALOPRAM 10MG  TABLETS] 30 tablet 0    Sig: TAKE 1 TABLET BY MOUTH EVERY DAY     Psychiatry:  Antidepressants - SSRI Passed - 04/09/2021 11:02 AM      Passed - Completed PHQ-2 or PHQ-9 in the last 360 days      Passed - Valid encounter within last 6 months    Recent Outpatient Visits          1 month ago Depression, major, single episode, moderate ([place])   [place], Lauren A, NP   2 months ago Depression, major, single episode, moderate ([place])   [place] McElwee, Scheryl Darter, NP

## 2021-04-17 ENCOUNTER — Other Ambulatory Visit: Payer: Self-pay | Admitting: Nurse Practitioner

## 2021-04-18 NOTE — Telephone Encounter (Signed)
Requested medications are due for refill today.  Might be too soon  Requested medications are on the active medications list.  yes  Last refill. 03/12/2021, #30 with 1 refill  Future visit scheduled.   no  Notes to clinic.  Medication not delegated.    Requested Prescriptions  Pending Prescriptions Disp Refills   ARIPiprazole (ABILIFY) 2 MG tablet [Pharmacy Med Name: ARIPIPRAZOLE 2MG  TABLETS] 30 tablet 1    Sig: TAKE 1 TABLET(2 MG) BY MOUTH DAILY     Not Delegated - Psychiatry:  Antipsychotics - Second Generation (Atypical) - aripiprazole Failed - 04/17/2021  9:57 AM      Failed - This refill cannot be delegated      Passed - Valid encounter within last 6 months    Recent Outpatient Visits           1 month ago Depression, major, single episode, moderate ([place])   [place], Lauren A, NP   2 months ago Depression, major, single episode, moderate ([place])   [place] McElwee, Scheryl Darter, NP

## 2021-05-09 ENCOUNTER — Other Ambulatory Visit: Payer: Self-pay

## 2021-05-09 ENCOUNTER — Other Ambulatory Visit: Payer: Self-pay | Admitting: Nurse Practitioner

## 2021-05-09 ENCOUNTER — Ambulatory Visit
Admission: RE | Admit: 2021-05-09 | Discharge: 2021-05-09 | Disposition: A | Discharge status: Home / Self Care | Payer: 59 | Source: Ambulatory Visit | Attending: Nurse Practitioner | Admitting: Nurse Practitioner

## 2021-05-09 DIAGNOSIS — R928 Other abnormal and inconclusive findings on diagnostic imaging of breast: Secondary | ICD-10-CM

## 2021-05-09 DIAGNOSIS — Z1231 Encounter for screening mammogram for malignant neoplasm of breast: Secondary | ICD-10-CM | POA: Diagnosis present

## 2021-05-09 DIAGNOSIS — N6489 Other specified disorders of breast: Secondary | ICD-10-CM

## 2021-05-10 NOTE — Progress Notes (Signed)
Please let patient know that her mammogram was abnormal. There was asymmetry found in the right breast. The radiologist recommends she have a diagnostic mammogram and ultrasound. The orders have already been placed. Please make sure she has this scheduled.

## 2021-05-21 ENCOUNTER — Encounter: Payer: 59 | Admitting: Nurse Practitioner

## 2021-05-21 NOTE — Progress Notes (Signed)
BP (!) 141/83 (BP Location: Left Arm, Cuff Size: Normal)    Pulse 69    Temp 98.2 F (36.8 C) (Oral)    Ht 4\' 10"  (1.473 m)    Wt 121 lb 3.2 oz (55 kg)    SpO2 97%    BMI 25.33 kg/m    Subjective:    Patient ID: Amanda Krause, female    DOB: June 12, 1960, 61 y.o.   MRN: [id number]  HPI: Amanda Krause is a 61 y.o. female presenting on 05/22/2021 for comprehensive medical examination. Current medical complaints include:none  She currently lives with: Menopausal Symptoms: no  HYPERTENSION Hypertension status: uncontrolled  Satisfied with current treatment? no Duration of hypertension: years BP monitoring frequency:  daily BP range: 150/90 BP medication side effects:  no Medication compliance: excellent compliance Previous BP meds:none Aspirin: no Recurrent headaches: no Visual changes: no Palpitations: no Dyspnea: no Chest pain: no Lower extremity edema: no Dizzy/lightheaded: no   DEPRESSION Patient states the Lexapro is working well.  She does not like the Abilify.  It is causing her to gain weight.  Her mother is sick and battling cancer which adds to her stress.  Depression Screen done today and results listed below:  Depression screen [facility] 2/9 05/22/2021 03/08/2021 02/01/2021  Decreased Interest 2 2 3   Down, Depressed, Hopeless 2 2 3   PHQ - 2 Score 4 4 6   Altered sleeping 1 2 3   Tired, decreased energy 2 2 3   Change in appetite 2 0 0  Feeling bad or failure about yourself  2 2 2   Trouble concentrating 1 1 2   Moving slowly or fidgety/restless 1 0 2  Suicidal thoughts 1 1 2   PHQ-9 Score 14 12 20   Difficult doing work/chores - - Somewhat difficult    The patient does not have a history of falls. I did complete a risk assessment for falls. A plan of care for falls was documented.   Past Medical History:  Past Medical History:  Diagnosis Date   Arthritis    Depression    Herpes     Surgical History:  Past Surgical History:  Procedure Laterality Date   COLONOSCOPY  N/A 03/20/2021   Procedure: COLONOSCOPY;  Surgeon: Lucilla Lame, MD;  [facility] ENDOSCOPY;  Service: Endoscopy;  Laterality: N/A;   DIAGNOSTIC LAPAROSCOPY     OVARIAN CYST SURGERY      Medications:  Current Outpatient Medications on File Prior to Visit  Medication Sig   cholecalciferol (VITAMIN D3) 25 MCG (1000 UNIT) tablet Take 1,000 Units by mouth daily.   nicotine (NICODERM CQ) 21 mg/24hr patch Place 1 patch (21 mg total) onto the skin daily.   valACYclovir (VALTREX) 1000 MG tablet Take 1,000 mg by mouth 2 (two) times daily.   vitamin B-12 (CYANOCOBALAMIN) 100 MCG tablet Take 100 mcg by mouth daily.   Sod Picosulfate-Mag Ox-Cit Acd (CLENPIQ) 10-3.5-12 MG-GM -GM/160ML SOLN Take 320 mLs by mouth as directed. (Patient not taking: Reported on 05/22/2021)   No current facility-administered medications on file prior to visit.    Allergies:  No Known Allergies  Social History:  Social History   Socioeconomic History   Marital status: Single    Spouse name: Not on file   Number of children: Not on file   Years of education: Not on file   Highest education level: Not on file  Occupational History   Not on file  Tobacco Use   Smoking status: Every Day    Types: Cigarettes  Smokeless tobacco: Never  Vaping Use   Vaping Use: Never used  Substance and Sexual Activity   Alcohol use: Yes    Alcohol/week: 8.0 standard drinks    Types: 8 Cans of beer per week    Comment: Every night   Drug use: Never   Sexual activity: Not Currently  Other Topics Concern   Not on file  Social History Narrative   Not on file   Social Determinants of Health   Financial Resource Strain: Not on file  Food Insecurity: Not on file  Transportation Needs: Not on file  Physical Activity: Not on file  Stress: Not on file  Social Connections: Not on file  Intimate Partner Violence: Not on file   Social History   Tobacco Use  Smoking Status Every Day   Types: Cigarettes  Smokeless Tobacco  Never   Social History   Substance and Sexual Activity  Alcohol Use Yes   Alcohol/week: 8.0 standard drinks   Types: 8 Cans of beer per week   Comment: Every night    Family History:  Family History  Problem Relation Age of Onset   Colon cancer Mother    Heart disease Father    Breast cancer Neg Hx     Past medical history, surgical history, medications, allergies, family history and social history reviewed with patient today and changes made to appropriate areas of the chart.   Review of Systems  Eyes:  Negative for blurred vision and double vision.  Respiratory:  Negative for shortness of breath.   Cardiovascular:  Negative for chest pain, palpitations and leg swelling.  Neurological:  Negative for dizziness and headaches.  Psychiatric/Behavioral:  Positive for depression. The patient is nervous/anxious.   All other ROS negative except what is listed above and in the HPI.      Objective:    BP (!) 141/83 (BP Location: Left Arm, Cuff Size: Normal)    Pulse 69    Temp 98.2 F (36.8 C) (Oral)    Ht 4\' 10"  (1.473 m)    Wt 121 lb 3.2 oz (55 kg)    SpO2 97%    BMI 25.33 kg/m   Wt Readings from Last 3 Encounters:  05/22/21 121 lb 3.2 oz (55 kg)  03/20/21 115 lb (52.2 kg)  03/08/21 115 lb (52.2 kg)    Physical Exam Vitals and nursing note reviewed. Exam conducted with a chaperone present [facility] [place], [place]).  Constitutional:      General: She is awake. She is not in acute distress.    Appearance: She is well-developed. She is not ill-appearing.  HENT:     Head: Normocephalic and atraumatic.     Right Ear: Hearing, tympanic membrane, ear canal and external ear normal. No drainage.     Left Ear: Hearing, tympanic membrane, ear canal and external ear normal. No drainage.     Nose: Nose normal.     Right Sinus: No maxillary sinus tenderness or frontal sinus tenderness.     Left Sinus: No maxillary sinus tenderness or frontal sinus tenderness.     Mouth/Throat:     Mouth:  Mucous membranes are moist.     Pharynx: Oropharynx is clear. Uvula midline. No pharyngeal swelling, oropharyngeal exudate or posterior oropharyngeal erythema.  Eyes:     General: Lids are normal.        Right eye: No discharge.        Left eye: No discharge.     Extraocular Movements: Extraocular movements intact.  Conjunctiva/sclera: Conjunctivae normal.     Pupils: Pupils are equal, round, and reactive to light.     Visual Fields: Right eye visual fields normal and left eye visual fields normal.  Neck:     Thyroid: No thyromegaly.     Vascular: No carotid bruit.     Trachea: Trachea normal.  Cardiovascular:     Rate and Rhythm: Normal rate and regular rhythm.     Heart sounds: Normal heart sounds. No murmur heard.   No gallop.  Pulmonary:     Effort: Pulmonary effort is normal. No accessory muscle usage or respiratory distress.     Breath sounds: Normal breath sounds.  Chest:  Breasts:    Right: Normal.     Left: Normal.  Abdominal:     General: Bowel sounds are normal.     Palpations: Abdomen is soft. There is no hepatomegaly or splenomegaly.     Tenderness: There is no abdominal tenderness.  Genitourinary:    Vagina: Normal.     Cervix: Normal.     Adnexa: Right adnexa normal and left adnexa normal.  Musculoskeletal:        General: Normal range of motion.     Cervical back: Normal range of motion and neck supple.     Right lower leg: No edema.     Left lower leg: No edema.  Lymphadenopathy:     Head:     Right side of head: No submental, submandibular, tonsillar, preauricular or posterior auricular adenopathy.     Left side of head: No submental, submandibular, tonsillar, preauricular or posterior auricular adenopathy.     Cervical: No cervical adenopathy.     Upper Body:     Right upper body: No supraclavicular, axillary or pectoral adenopathy.     Left upper body: No supraclavicular, axillary or pectoral adenopathy.  Skin:    General: Skin is warm and dry.      Capillary Refill: Capillary refill takes less than 2 seconds.     Findings: No rash.  Neurological:     Mental Status: She is alert and oriented to person, place, and time.     Gait: Gait is intact.     Deep Tendon Reflexes: Reflexes are normal and symmetric.     Reflex Scores:      Brachioradialis reflexes are 2+ on the right side and 2+ on the left side.      Patellar reflexes are 2+ on the right side and 2+ on the left side. Psychiatric:        Attention and Perception: Attention normal.        Mood and Affect: Mood normal.        Speech: Speech normal.        Behavior: Behavior normal. Behavior is cooperative.        Thought Content: Thought content normal.        Judgment: Judgment normal.    Results for orders placed or performed during the hospital encounter of 03/20/21  Surgical pathology  Result Value Ref Range   SURGICAL PATHOLOGY      SURGICAL PATHOLOGY CASE: [id number] PATIENT: Kristianne Vanderweele Surgical Pathology Report     Specimen Submitted: A. Colon polyp x2, transverse; cold snare B. Colon polyp, descending; cold snare C. Colon polyp, sigmoid; hot snare  Clinical History: Colon cancer screening and family history of colon cancer.  Colon polyps      DIAGNOSIS: A. COLON POLYP X2, TRANSVERSE; COLD SNARE: - TUBULAR ADENOMA, TWO FRAGMENTS. -  INTRAMUCOSAL LYMPHOID AGGREGATES, TWO FRAGMENTS. - NEGATIVE FOR HIGH GRADE DYSPLASIA AND MALIGNANCY.  B. COLON POLYP, DESCENDING; COLD SNARE: - TUBULAR ADENOMA. - NEGATIVE FOR HIGH GRADE DYSPLASIA AND MALIGNANCY.  C. COLON POLYP, SIGMOID; HOT SNARE: - TUBULAR ADENOMA. - NEGATIVE FOR HIGH GRADE DYSPLASIA AND MALIGNANCY.   GROSS DESCRIPTION: A. Labeled: Transverse colon polyp cold snare x2 Received: Formalin Collection time: 10:12 AM on 03/20/2021 Placed into formalin time: 10:12 AM on 03/20/2021 Tissue fragment(s): Multiple Size: Aggregate, 2. 5 x 0.3 x 0.2 cm Description: Received is a single fragment of  tan-pink soft tissue admixed with intestinal debris.  The ratio of soft tissue to intestinal debris is 20: 80. Entirely submitted in 1 cassette.  B. Labeled: Descending colon polyp cold snare Received: Formalin Collection time: 10:16 AM on 03/20/2021 Placed into formalin time: 10:16 AM on 03/20/2021 Tissue fragment(s): Multiple Size: Aggregate, 0.4 x 0.3 x 0.2 cm Description: Received is a single fragment of tan soft tissue admixed with intestinal debris.  The ratio of soft tissue to intestinal debris is 90: 10. Entirely submitted in 1 cassette.  C. Labeled: Sigmoid colon polyp hot snare Received: Formalin Collection time: 10:21 AM on 03/20/2021 Placed into formalin time: 10:21 AM on 03/20/2021 Tissue fragment(s): 1 Size: 0.9 x 0.8 x 0.3 cm Description: Received is a single fragment of red lobulated soft tissue. There is a resection margin which is inked green.  The specimen is trisected. Entirely  submitted in 1 cassette.  RB 03/20/2021  Final Diagnosis performed by Betsy Pries, MD.   Electronically signed 03/21/2021 10:40:31AM The electronic signature indicates that the named Attending Pathologist has evaluated the specimen Technical component performed at [facility], [address], [place], [place] [address] Lab: [phone] Dir: Rush Farmer, MD, MMM  Professional component performed at [facility], [facility], [place], [place], [place] [address] Lab: [phone] Dir: Kathi Simpers, MD       Assessment & Plan:   Problem List Items Addressed This Visit       Cardiovascular and Mediastinum   Primary hypertension    Chronic. Not well controlled.  Will start Lisinopril 10mg  daily. Side effects and benefits of medication discussed during visit. Recommend keeping log over the next month and bring to next visit.  Follow up in 1 month for reevaluation.      Relevant Medications   lisinopril (ZESTRIL) 10 MG tablet     Other   Depression,  major, single episode, moderate (HCC)    Chronic.  Not well controlled.  Will increase Lexapro from 10mg  to 20mg .  Will stop Abilify.  Discussed how to wean down off Abilify.  Labs ordered today.  Return to clinic in 1 months for reevaluation.  Call sooner if concerns arise.        Relevant Medications   escitalopram (LEXAPRO) 20 MG tablet   Other Visit Diagnoses     Annual physical exam    -  Primary   Health maintenance reviewed during visit. Labs ordered today. FLu shot given. Will give pneumonia shot at next vist. Up to date on mammo and cologuard.   Relevant Orders   CBC with Differential/Platelet   Comprehensive metabolic panel   Lipid panel   TSH   Urinalysis, Routine w reflex microscopic   Cytology - PAP   Encounter for hepatitis C screening test for low risk patient       Relevant Orders   Hepatitis C Antibody   Screening for HIV (human immunodeficiency virus)  Relevant Medications   valACYclovir (VALTREX) 1000 MG tablet   Other Relevant Orders   HIV Antibody (routine testing w rflx)   Need for influenza vaccination       Relevant Orders   Flu Vaccine QUAD 6+ mos PF IM (Fluarix Quad PF) (Completed)        Follow up plan: Return in about 1 month (around 06/19/2021) for BP Check.   LABORATORY TESTING:  - Pap smear: pap done  IMMUNIZATIONS:   - Tdap: Tetanus vaccination status reviewed: last tetanus booster within 10 years. - Influenza: Administered today - Pneumovax:  will get at next visit - Prevnar: Not applicable - COVID: Up to date - HPV: Not applicable - Shingrix vaccine: Up to date  SCREENING: -Mammogram: Up to date  - Colonoscopy: Up to date  - Bone Density: Not applicable  -Hearing Test: Not applicable  -Spirometry: Not applicable   PATIENT COUNSELING:   Advised to take 1 mg of folate supplement per day if capable of pregnancy.   Sexuality: Discussed sexually transmitted diseases, partner selection, use of condoms, avoidance of unintended  pregnancy  and contraceptive alternatives.   Advised to avoid cigarette smoking.  I discussed with the patient that most people either abstain from alcohol or drink within safe limits (<=14/week and <=4 drinks/occasion for males, <=7/weeks and <= 3 drinks/occasion for females) and that the risk for alcohol disorders and other health effects rises proportionally with the number of drinks per week and how often a drinker exceeds daily limits.  Discussed cessation/primary prevention of drug use and availability of treatment for abuse.   Diet: Encouraged to adjust caloric intake to maintain  or achieve ideal body weight, to reduce intake of dietary saturated fat and total fat, to limit sodium intake by avoiding high sodium foods and not adding table salt, and to maintain adequate dietary potassium and calcium preferably from fresh fruits, vegetables, and low-fat dairy products.    stressed the importance of regular exercise  Injury prevention: Discussed safety belts, safety helmets, smoke detector, smoking near bedding or upholstery.   Dental health: Discussed importance of regular tooth brushing, flossing, and dental visits.    NEXT PREVENTATIVE PHYSICAL DUE IN 1 YEAR. Return in about 1 month (around 06/19/2021) for BP Check.

## 2021-05-22 ENCOUNTER — Other Ambulatory Visit: Payer: Self-pay

## 2021-05-22 ENCOUNTER — Ambulatory Visit (INDEPENDENT_AMBULATORY_CARE_PROVIDER_SITE_OTHER): Payer: 59 | Admitting: Nurse Practitioner

## 2021-05-22 ENCOUNTER — Other Ambulatory Visit (HOSPITAL_COMMUNITY)
Admission: RE | Admit: 2021-05-22 | Discharge: 2021-05-22 | Disposition: A | Discharge status: Home / Self Care | Payer: 59 | Source: Ambulatory Visit | Attending: Nurse Practitioner | Admitting: Nurse Practitioner

## 2021-05-22 ENCOUNTER — Encounter: Payer: Self-pay | Admitting: Nurse Practitioner

## 2021-05-22 ENCOUNTER — Ambulatory Visit
Admission: RE | Admit: 2021-05-22 | Discharge: 2021-05-22 | Disposition: A | Discharge status: Home / Self Care | Payer: 59 | Source: Ambulatory Visit | Attending: Nurse Practitioner | Admitting: Nurse Practitioner

## 2021-05-22 VITALS — BP 141/83 | HR 69 | Temp 98.2°F | Ht <= 58 in | Wt 121.2 lb

## 2021-05-22 DIAGNOSIS — Z114 Encounter for screening for human immunodeficiency virus [HIV]: Secondary | ICD-10-CM

## 2021-05-22 DIAGNOSIS — R928 Other abnormal and inconclusive findings on diagnostic imaging of breast: Secondary | ICD-10-CM | POA: Insufficient documentation

## 2021-05-22 DIAGNOSIS — Z0000 Encounter for general adult medical examination without abnormal findings: Secondary | ICD-10-CM | POA: Insufficient documentation

## 2021-05-22 DIAGNOSIS — N6489 Other specified disorders of breast: Secondary | ICD-10-CM | POA: Diagnosis present

## 2021-05-22 DIAGNOSIS — Z1159 Encounter for screening for other viral diseases: Secondary | ICD-10-CM | POA: Diagnosis not present

## 2021-05-22 DIAGNOSIS — R030 Elevated blood-pressure reading, without diagnosis of hypertension: Secondary | ICD-10-CM

## 2021-05-22 DIAGNOSIS — F321 Major depressive disorder, single episode, moderate: Secondary | ICD-10-CM

## 2021-05-22 DIAGNOSIS — Z23 Encounter for immunization: Secondary | ICD-10-CM | POA: Diagnosis not present

## 2021-05-22 DIAGNOSIS — R768 Other specified abnormal immunological findings in serum: Secondary | ICD-10-CM

## 2021-05-22 DIAGNOSIS — I10 Essential (primary) hypertension: Secondary | ICD-10-CM | POA: Diagnosis not present

## 2021-05-22 LAB — URINALYSIS, ROUTINE W REFLEX MICROSCOPIC
Bilirubin, UA: NEGATIVE
Glucose, UA: NEGATIVE
Ketones, UA: NEGATIVE
Leukocytes,UA: NEGATIVE
Nitrite, UA: NEGATIVE
Protein,UA: NEGATIVE
RBC, UA: NEGATIVE
Specific Gravity, UA: 1.01 (ref 1.005–1.030)
Urobilinogen, Ur: 0.2 mg/dL (ref 0.2–1.0)
pH, UA: 6 (ref 5.0–7.5)

## 2021-05-22 MED ORDER — LISINOPRIL 10 MG PO TABS: 10.0000 mg | ORAL_TABLET | Freq: Every day | ORAL | 0 refills | Status: DC

## 2021-05-22 MED ORDER — ESCITALOPRAM OXALATE 20 MG PO TABS: 20.0000 mg | ORAL_TABLET | Freq: Every day | ORAL | 1 refills | Status: DC

## 2021-05-22 NOTE — Assessment & Plan Note (Signed)
Chronic. Not well controlled.  Will start Lisinopril 10mg  daily. Side effects and benefits of medication discussed during visit. Recommend keeping log over the next month and bring to next visit.  Follow up in 1 month for reevaluation.

## 2021-05-22 NOTE — Assessment & Plan Note (Signed)
Chronic.  Not well controlled.  Will increase Lexapro from 10mg  to 20mg .  Will stop Abilify.  Discussed how to wean down off Abilify.  Labs ordered today.  Return to clinic in 1 months for reevaluation.  Call sooner if concerns arise.

## 2021-05-23 LAB — COMPREHENSIVE METABOLIC PANEL WITH GFR
ALT: 259 [IU]/L — ABNORMAL HIGH (ref 0–32)
AST: 190 [IU]/L — ABNORMAL HIGH (ref 0–40)
Albumin/Globulin Ratio: 1.6 (ref 1.2–2.2)
Albumin: 4.4 g/dL (ref 3.8–4.8)
Alkaline Phosphatase: 93 [IU]/L (ref 44–121)
BUN/Creatinine Ratio: 19 (ref 12–28)
BUN: 12 mg/dL (ref 8–27)
Bilirubin Total: 0.5 mg/dL (ref 0.0–1.2)
CO2: 24 mmol/L (ref 20–29)
Calcium: 9.3 mg/dL (ref 8.7–10.3)
Chloride: 100 mmol/L (ref 96–106)
Creatinine, Ser: 0.64 mg/dL (ref 0.57–1.00)
Globulin, Total: 2.7 g/dL (ref 1.5–4.5)
Glucose: 101 mg/dL — ABNORMAL HIGH (ref 70–99)
Potassium: 4.2 mmol/L (ref 3.5–5.2)
Sodium: 139 mmol/L (ref 134–144)
Total Protein: 7.1 g/dL (ref 6.0–8.5)
eGFR: 100 mL/min/{1.73_m2}

## 2021-05-23 LAB — LIPID PANEL
Chol/HDL Ratio: 2.1 ratio (ref 0.0–4.4)
Cholesterol, Total: 130 mg/dL (ref 100–199)
HDL: 62 mg/dL (ref >39)
LDL Chol Calc (NIH): 55 mg/dL (ref 0–99)
Triglycerides: 61 mg/dL (ref 0–149)
VLDL Cholesterol Cal: 13 mg/dL (ref 5–40)

## 2021-05-23 LAB — CBC WITH DIFFERENTIAL/PLATELET
Basophils Absolute: 0.1 10*3/uL (ref 0.0–0.2)
Basos: 1 %
EOS (ABSOLUTE): 0.1 10*3/uL (ref 0.0–0.4)
Eos: 1 %
Hematocrit: 47.2 % — ABNORMAL HIGH (ref 34.0–46.6)
Hemoglobin: 16.6 g/dL — ABNORMAL HIGH (ref 11.1–15.9)
Immature Grans (Abs): 0 10*3/uL (ref 0.0–0.1)
Immature Granulocytes: 0 %
Lymphocytes Absolute: 2.2 10*3/uL (ref 0.7–3.1)
Lymphs: 41 %
MCH: 33.1 pg — ABNORMAL HIGH (ref 26.6–33.0)
MCHC: 35.2 g/dL (ref 31.5–35.7)
MCV: 94 fL (ref 79–97)
Monocytes Absolute: 0.4 10*3/uL (ref 0.1–0.9)
Monocytes: 8 %
Neutrophils Absolute: 2.6 10*3/uL (ref 1.4–7.0)
Neutrophils: 49 %
Platelets: 189 10*3/uL (ref 150–450)
RBC: 5.01 x10E6/uL (ref 3.77–5.28)
RDW: 11.2 % — ABNORMAL LOW (ref 11.7–15.4)
WBC: 5.4 10*3/uL (ref 3.4–10.8)

## 2021-05-23 LAB — HEPATITIS C ANTIBODY: Hep C Virus Ab: >11 s/co ratio — ABNORMAL HIGH (ref 0.0–0.9)

## 2021-05-23 LAB — TSH: TSH: 0.999 u[IU]/mL (ref 0.450–4.500)

## 2021-05-23 LAB — HIV ANTIBODY (ROUTINE TESTING W REFLEX): HIV Screen 4th Generation wRfx: NONREACTIVE

## 2021-05-23 NOTE — Addendum Note (Signed)
Addended by: Jon Billings on: 05/23/2021 10:37 AM   Modules accepted: Orders

## 2021-05-23 NOTE — Progress Notes (Signed)
Please let patient know her Mammogram did not show any evidence of a malignancy.  The recommendation is to repeat the Mammogram in 1 year.  

## 2021-05-23 NOTE — Progress Notes (Signed)
Please call patient and let her know that her lab work shows that her hepatitis C screening was positive.  Her liver enzymes are also elevated this is likely due to the hepatitis C infection.  I would like her to come in and do further lab work to determine more about the Hepatitis C.  I have also ordered an ultrasound of her liver due to the liver enzymes being elevated and placed a referral for her to see GI.  Please let me know if she has further questions.  Please make patient a lab appt.

## 2021-05-24 ENCOUNTER — Telehealth: Payer: Self-pay

## 2021-05-24 LAB — CYTOLOGY - PAP: Diagnosis: NEGATIVE

## 2021-05-24 NOTE — Progress Notes (Signed)
HI Amanda Krause. Your PAP was normal.  We will repeat it in 3 years.  Please let me know if you have any questions.

## 2021-05-24 NOTE — Telephone Encounter (Signed)
CALLED PATIENT NO ANSWER LEFT VOICEMAIL FOR A CALL BACK ? ?

## 2021-05-25 ENCOUNTER — Other Ambulatory Visit: Payer: Self-pay

## 2021-05-25 ENCOUNTER — Other Ambulatory Visit: Payer: 59

## 2021-05-25 ENCOUNTER — Telehealth: Payer: Self-pay

## 2021-05-25 DIAGNOSIS — R768 Other specified abnormal immunological findings in serum: Secondary | ICD-10-CM

## 2021-05-25 NOTE — Telephone Encounter (Signed)
SCHEDULED FOR 07/08/2021

## 2021-05-30 LAB — HCV GENOTYPE REFLEX NS5A: Hepatitis C Genotype: 3

## 2021-05-30 LAB — HCV RNA QUANT
HCV log10: 6.695 log10 IU/mL
Hepatitis C Quantitation: 4960000 IU/mL

## 2021-05-30 LAB — REFLEX TEST INFORMATION

## 2021-05-30 NOTE — Progress Notes (Signed)
Please let patient know that her lab work shows that she does need to be treated for Hepatitis C.  I recommend she keep her appt for March with the GI doctor.

## 2021-06-04 ENCOUNTER — Other Ambulatory Visit: Payer: Self-pay

## 2021-06-04 ENCOUNTER — Ambulatory Visit
Admission: RE | Admit: 2021-06-04 | Discharge: 2021-06-04 | Disposition: A | Discharge status: Home / Self Care | Payer: 59 | Source: Ambulatory Visit | Attending: Nurse Practitioner | Admitting: Nurse Practitioner

## 2021-06-04 DIAGNOSIS — R768 Other specified abnormal immunological findings in serum: Secondary | ICD-10-CM | POA: Insufficient documentation

## 2021-06-04 NOTE — Progress Notes (Signed)
Please let patient know that her US of the liver was unremarkable. Make sure to keep the appt with GI for March 15.

## 2021-06-20 ENCOUNTER — Other Ambulatory Visit: Payer: Self-pay

## 2021-06-20 ENCOUNTER — Encounter: Payer: Self-pay | Admitting: Nurse Practitioner

## 2021-06-20 ENCOUNTER — Ambulatory Visit (INDEPENDENT_AMBULATORY_CARE_PROVIDER_SITE_OTHER): Payer: 59 | Admitting: Nurse Practitioner

## 2021-06-20 VITALS — BP 127/73 | HR 79 | Temp 98.4°F | Wt 119.4 lb

## 2021-06-20 DIAGNOSIS — I10 Essential (primary) hypertension: Secondary | ICD-10-CM

## 2021-06-20 MED ORDER — LISINOPRIL 10 MG PO TABS: 10.0000 mg | ORAL_TABLET | Freq: Every day | ORAL | 1 refills | Status: DC

## 2021-06-20 NOTE — Progress Notes (Signed)
? ?BP 127/73 Comment: home reading  Pulse 79   Temp 98.4 ?F (36.9 ?C) (Oral)   Wt 119 lb 6.4 oz (54.2 kg)   SpO2 98%   BMI 24.95 kg/m?   ? ?Subjective:  ? ? Patient ID: Amanda Krause, female    DOB: 11/05/1960, 61 y.o.   MRN: [id number] ? ?HPI: ?Amanda Krause is a 61 y.o. female ? ?Chief Complaint  ?Patient presents with  ? Hypertension  ?  1 month f/up   ? ?HYPERTENSION ?Hypertension status: controlled  ?Satisfied with current treatment? no ?Duration of hypertension: years ?BP monitoring frequency:  daily ?BP range: 120/70 ?BP medication side effects:  no ?Medication compliance: excellent compliance ?Previous BP meds:lisinopril ?Aspirin: no ?Recurrent headaches: no ?Visual changes: no ?Palpitations: no ?Dyspnea: no ?Chest pain: no ?Lower extremity edema: no ?Dizzy/lightheaded: sometimes ? ?Relevant past medical, surgical, family and social history reviewed and updated as indicated. Interim medical history since our last visit reviewed. ?Allergies and medications reviewed and updated. ? ?Review of Systems  ?Eyes:  Negative for visual disturbance.  ?Respiratory:  Negative for cough, chest tightness and shortness of breath.   ?Cardiovascular:  Negative for chest pain, palpitations and leg swelling.  ?Neurological:  Negative for dizziness and headaches.  ? ?Per HPI unless specifically indicated above ? ?   ?Objective:  ?  ?BP 127/73 Comment: home reading  Pulse 79   Temp 98.4 ?F (36.9 ?C) (Oral)   Wt 119 lb 6.4 oz (54.2 kg)   SpO2 98%   BMI 24.95 kg/m?   ?Wt Readings from Last 3 Encounters:  ?06/20/21 119 lb 6.4 oz (54.2 kg)  ?05/22/21 121 lb 3.2 oz (55 kg)  ?03/20/21 115 lb (52.2 kg)  ?  ?Physical Exam ?Vitals and nursing note reviewed.  ?Constitutional:   ?   General: She is not in acute distress. ?   Appearance: Normal appearance. She is normal weight. She is not ill-appearing, toxic-appearing or diaphoretic.  ?HENT:  ?   Head: Normocephalic.  ?   Right Ear: External ear normal.  ?   Left Ear: External ear  normal.  ?   Nose: Nose normal.  ?   Mouth/Throat:  ?   Mouth: Mucous membranes are moist.  ?   Pharynx: Oropharynx is clear.  ?Eyes:  ?   General:     ?   Right eye: No discharge.     ?   Left eye: No discharge.  ?   Extraocular Movements: Extraocular movements intact.  ?   Conjunctiva/sclera: Conjunctivae normal.  ?   Pupils: Pupils are equal, round, and reactive to light.  ?Cardiovascular:  ?   Rate and Rhythm: Normal rate and regular rhythm.  ?   Heart sounds: No murmur heard. ?Pulmonary:  ?   Effort: Pulmonary effort is normal. No respiratory distress.  ?   Breath sounds: Normal breath sounds. No wheezing or rales.  ?Musculoskeletal:  ?   Cervical back: Normal range of motion and neck supple.  ?Skin: ?   General: Skin is warm and dry.  ?   Capillary Refill: Capillary refill takes less than 2 seconds.  ?Neurological:  ?   General: No focal deficit present.  ?   Mental Status: She is alert and oriented to person, place, and time. Mental status is at baseline.  ?Psychiatric:     ?   Mood and Affect: Mood normal.     ?   Behavior: Behavior normal.     ?  Thought Content: Thought content normal.     ?   Judgment: Judgment normal.  ? ? ?Results for orders placed or performed in visit on 05/25/21  ?HCV Genotype Reflex NS5A  ?Result Value Ref Range  ? Hepatitis C Genotype 3   ? Please note: Comment   ?HCV RNA quant  ?Result Value Ref Range  ? Hepatitis C Quantitation 4,960,000 IU/mL  ? HCV log10 6.695 log10 IU/mL  ? Test Information Comment   ?Reflex Test Information  ?Result Value Ref Range  ? Reflex Test Information Comment   ? ?   ?Assessment & Plan:  ? ?Problem List Items Addressed This Visit   ? ?  ? Cardiovascular and Mediastinum  ? Primary hypertension - Primary  ?  Chronic.  Controlled.  Continue with current medication regimen of Lisinopril 10mg .  Refills sent today. Return to clinic in 3 months for reevaluation.  Call sooner if concerns arise.  ? ?  ?  ? Relevant Medications  ? lisinopril (ZESTRIL) 10 MG  tablet  ?  ? ?Follow up plan: ?Return in about 3 months (around 09/20/2021) for BP Check. ? ? ? ? ? ?

## 2021-06-20 NOTE — Assessment & Plan Note (Signed)
Chronic.  Controlled.  Continue with current medication regimen of Lisinopril 10mg .  Refills sent today. Return to clinic in 3 months for reevaluation.  Call sooner if concerns arise.  ? ?

## 2021-06-21 ENCOUNTER — Other Ambulatory Visit: Payer: Self-pay | Admitting: Nurse Practitioner

## 2021-06-21 NOTE — Telephone Encounter (Signed)
Duplicate request- Rx sent in yesterday ?Requested Prescriptions  ?Pending Prescriptions Disp Refills  ?? lisinopril (ZESTRIL) 10 MG tablet [Pharmacy Med Name: LISINOPRIL 10MG  TABLETS] 30 tablet   ?  Sig: TAKE 1 TABLET(10 MG) BY MOUTH DAILY  ?  ? Cardiovascular:  ACE Inhibitors Passed - 06/21/2021  1:30 PM  ?  ?  Passed - Cr in normal range and within 180 days  ?  Creatinine, Ser  ?Date Value Ref Range Status  ?05/22/2021 0.64 0.57 - 1.00 mg/dL Final  ?   ?  ?  Passed - K in normal range and within 180 days  ?  Potassium  ?Date Value Ref Range Status  ?05/22/2021 4.2 3.5 - 5.2 mmol/L Final  ?   ?  ?  Passed - Patient is not pregnant  ?  ?  Passed - Last BP in normal range  ?  BP Readings from Last 1 Encounters:  ?06/20/21 127/73  ?   ?  ?  Passed - Valid encounter within last 6 months  ?  Recent Outpatient Visits   ?      ? Yesterday Primary hypertension  ? [place], NP  ? 1 month ago Annual physical exam  ? [place], NP  ? 3 months ago Depression, major, single episode, moderate ([place] [facility])  ? [place], Lauren A, NP  ? 4 months ago Depression, major, single episode, moderate ([place])  ? [facility], Scheryl Darter, NP  ?  ?  ?Future Appointments   ?        ? In 3 months Jon Billings, NP [facility]  ?  ? ?  ?  ?  ? ?

## 2021-07-04 ENCOUNTER — Encounter: Payer: Self-pay | Admitting: Gastroenterology

## 2021-07-04 ENCOUNTER — Ambulatory Visit (INDEPENDENT_AMBULATORY_CARE_PROVIDER_SITE_OTHER): Payer: 59 | Admitting: Gastroenterology

## 2021-07-04 ENCOUNTER — Other Ambulatory Visit: Payer: Self-pay

## 2021-07-04 VITALS — BP 154/86 | HR 80 | Temp 98.3°F | Ht <= 58 in | Wt 120.0 lb

## 2021-07-04 DIAGNOSIS — B182 Chronic viral hepatitis C: Secondary | ICD-10-CM | POA: Diagnosis not present

## 2021-07-04 NOTE — Progress Notes (Signed)
? ? ?Gastroenterology Consultation ? ?Referring Provider:     Jon Billings, NP ?Primary Care Physician:  Jon Billings, NP ?Primary Gastroenterologist:  Dr. Allen Norris     ?Reason for Consultation:     Hepatitis C ?      ? HPI:   ?Amanda Krause is a 61 y.o. y/o female referred for consultation & management of Hepatitis C by Dr. Jon Billings, NP.   This patient comes in today with a diagnosis of hepatitis C. The patient was found to have a positive viral load of 4,960,000 international units per milliliter with a genotype 3 virus. The hepatitis C blood tests was done for screening purposes. The patient has a history of having a colonoscopy with multiple tubular adenomas with a repeat colonoscopy recommended in 2027. ?The patient reports that she used IV drugs many years ago when she was in her teens but nothing since then.  The patient denies any homemade tattoos blood transfusion before 1990. ? ?Past Medical History:  ?Diagnosis Date  ? Arthritis   ? Depression   ? Herpes   ? ? ?Past Surgical History:  ?Procedure Laterality Date  ? COLONOSCOPY N/A 03/20/2021  ? Procedure: COLONOSCOPY;  Surgeon: Lucilla Lame, MD;  Location: [facility] ENDOSCOPY;  Service: Endoscopy;  Laterality: N/A;  ? DIAGNOSTIC LAPAROSCOPY    ? OVARIAN CYST SURGERY    ? ? ?Prior to Admission medications   ?Medication Sig Start Date End Date Taking? Authorizing Provider  ?cholecalciferol (VITAMIN D3) 25 MCG (1000 UNIT) tablet Take 1,000 Units by mouth daily.    [provider]  ?escitalopram (LEXAPRO) 20 MG tablet Take 1 tablet (20 mg total) by mouth daily. 05/22/21   Jon Billings, NP  ?lisinopril (ZESTRIL) 10 MG tablet Take 1 tablet (10 mg total) by mouth daily. 06/20/21   Jon Billings, NP  ?nicotine (NICODERM CQ) 21 mg/24hr patch Place 1 patch (21 mg total) onto the skin daily. 02/01/21   McEl[place], Scheryl Darter, NP  ?valACYclovir (VALTREX) 1000 MG tablet Take 1,000 mg by mouth 2 (two) times daily. 02/11/21   [provider]   ?vitamin B-12 (CYANOCOBALAMIN) 100 MCG tablet Take 100 mcg by mouth daily.    [provider]  ? ? ?Family History  ?Problem Relation Age of Onset  ? Colon cancer Mother   ? Heart disease Father   ? Breast cancer Neg Hx   ?  ? ?Social History  ? ?Tobacco Use  ? Smoking status: Every Day  ?  Packs/day: 0.25  ?  Types: Cigarettes  ? Smokeless tobacco: Never  ?Vaping Use  ? Vaping Use: Never used  ?Substance Use Topics  ? Alcohol use: Yes  ?  Comment: on occasion  ? Drug use: Never  ? ? ?Allergies as of 07/04/2021  ? (No Known Allergies)  ? ? ?Review of Systems:    ?All systems reviewed and negative except where noted in HPI. ? ? Physical Exam:  ?There were no vitals taken for this visit. ?No LMP recorded. Patient is postmenopausal. ?General:   Alert,  Well-developed, well-nourished, pleasant and cooperative in NAD ?Head:  Normocephalic and atraumatic. ?Eyes:  Sclera clear, no icterus.   Conjunctiva pink. ?Ears:  Normal auditory acuity. ?Neck:  Supple; no masses or thyromegaly. ?Lungs:  Respirations even and unlabored.  Clear throughout to auscultation.   No wheezes, crackles, or rhonchi. No acute distress. ?Heart:  Regular rate and rhythm; no murmurs, clicks, rubs, or gallops. ?Abdomen:  Normal bowel sounds.  No bruits.  Soft, non-tender and non-distended without masses, hepatosplenomegaly or hernias noted.  No guarding or rebound tenderness.  Negative Carnett sign.   ?Rectal:  Deferred.  ?Pulses:  Normal pulses noted. ?Extremities:  No clubbing or edema.  No cyanosis. ?Neurologic:  Alert and oriented x3;  grossly normal neurologically. ?Skin:  Intact without significant lesions or rashes.  No jaundice. ?Lymph Nodes:  No significant cervical adenopathy. ?Psych:  Alert and cooperative. Normal mood and affect. ? ?Imaging Studies: ?US Abdomen Limited RUQ (LIVER/GB) ? ?Result Date: 06/04/2021 ?CLINICAL DATA:  Elevated liver enzymes.  Chronic hepatitis C. EXAM: ULTRASOUND ABDOMEN LIMITED RIGHT UPPER QUADRANT  COMPARISON:  None. FINDINGS: Gallbladder: No gallstones or wall thickening visualized. No sonographic Murphy sign noted by sonographer. Common bile duct: Diameter: 4 mm in proximal diameter Liver: No focal lesion identified. Within normal limits in parenchymal echogenicity. Portal vein is patent on color Doppler imaging with normal direction of blood flow towards the liver. Other: None. IMPRESSION: Normal right upper quadrant sonogram Electronically Signed   By: Fidela Salisbury M.D.   On: 06/04/2021 10:31   ? ?Assessment and Plan:  ? ?Amanda Krause is a 61 y.o. y/o female who comes in today with a finding of hepatitis C genotype 3 with a viral load of 4,900,000international units/mL.  The patient will be set up for a FibroSure and then when that comes back will be set up for treatment for her hepatitis C.  The patient has been explained the plan and agrees with it. ? ? ? ?Lucilla Lame, MD. Marval Regal ? ? ? Note: This dictation was prepared with Dragon dictation along with smaller phrase technology. Any transcriptional errors that result from this process are unintentional.   ?

## 2021-07-06 LAB — NASH FIBROSURE
ALPHA 2-MACROGLOBULINS, QN: 430 mg/dL — ABNORMAL HIGH (ref 110–276)
ALT (SGPT) P5P: 225 IU/L — ABNORMAL HIGH (ref 0–40)
AST (SGOT) P5P: 212 IU/L — ABNORMAL HIGH (ref 0–40)
Apolipoprotein A-1: 158 mg/dL (ref 116–209)
Bilirubin, Total: 0.6 mg/dL (ref 0.0–1.2)
Cholesterol, Total: 139 mg/dL (ref 100–199)
Fibrosis Score: 0.77 — ABNORMAL HIGH (ref 0.00–0.21)
GGT: 305 IU/L — ABNORMAL HIGH (ref 0–60)
Glucose: 121 mg/dL — ABNORMAL HIGH (ref 70–99)
Haptoglobin: 150 mg/dL (ref 37–355)
Height: 58 in
NASH Score: 0.75 — ABNORMAL HIGH
Steatosis Score: 0.74 — ABNORMAL HIGH (ref 0.00–0.30)
Triglycerides: 84 mg/dL (ref 0–149)
Weight: 120 [lb_av]

## 2021-07-21 ENCOUNTER — Encounter: Payer: Self-pay | Admitting: Gastroenterology

## 2021-08-02 ENCOUNTER — Other Ambulatory Visit: Payer: Self-pay

## 2021-08-02 MED ORDER — NICOTINE 21 MG/24HR TD PT24: 21.0000 mg | MEDICATED_PATCH | Freq: Every day | TRANSDERMAL | 1 refills | Status: DC

## 2021-08-22 MED ORDER — SOFOSBUVIR-VELPATASVIR 400-100 MG PO TABS: 1.0000 | ORAL_TABLET | Freq: Every day | ORAL | 2 refills | Status: DC

## 2021-08-22 NOTE — Addendum Note (Signed)
Addended by: Lurlean Nanny on: 08/22/2021 02:45 PM ? ? Modules accepted: Orders ? ?

## 2021-08-28 ENCOUNTER — Telehealth: Payer: Self-pay

## 2021-08-28 NOTE — Telephone Encounter (Signed)
Opened in error

## 2021-08-31 ENCOUNTER — Encounter: Payer: Self-pay | Admitting: Gastroenterology

## 2021-09-20 ENCOUNTER — Ambulatory Visit: Payer: BC Managed Care – PPO | Admitting: Nurse Practitioner

## 2021-10-12 ENCOUNTER — Ambulatory Visit: Payer: BC Managed Care – PPO | Admitting: Nurse Practitioner

## 2021-11-02 ENCOUNTER — Ambulatory Visit: Payer: BC Managed Care – PPO | Admitting: Nurse Practitioner

## 2021-11-23 ENCOUNTER — Ambulatory Visit: Payer: BC Managed Care – PPO | Admitting: Nurse Practitioner

## 2021-11-23 ENCOUNTER — Encounter: Payer: Self-pay | Admitting: Nurse Practitioner

## 2021-11-23 VITALS — BP 135/75 | HR 75 | Temp 98.0°F | Ht <= 58 in | Wt 116.0 lb

## 2021-11-23 DIAGNOSIS — I10 Essential (primary) hypertension: Secondary | ICD-10-CM

## 2021-11-23 MED ORDER — LISINOPRIL 20 MG PO TABS: 20.0000 mg | ORAL_TABLET | Freq: Every day | ORAL | 0 refills | Status: DC

## 2021-11-23 NOTE — Progress Notes (Signed)
BP 135/75 (BP Location: Left Arm, Cuff Size: Normal)   Pulse 75   Temp 98 F (36.7 C) (Oral)   Ht 4' 10"  (1.473 m)   Wt 116 lb (52.6 kg)   SpO2 98%   BMI 24.24 kg/m    Subjective:    Patient ID: Amanda Krause, female    DOB: [birth year], 61 y.o.   MRN: [id number]  HPI: Amanda Krause is a 61 y.o. female  Chief Complaint  Patient presents with   Hypertension    Patient is here to follow up three month for Hypertension. Patient says she has not been able to keep up with her recent BP readings as her cuff broke at home. Patient says has noticed some days are better than other. Patient says she has started a new medication.    HYPERTENSION Hypertension status: uncontrolled Satisfied with current treatment? no Duration of hypertension: years BP monitoring frequency:  not checking BP range:  BP medication side effects:  no Medication compliance: excellent compliance Previous BP meds:lisinopril Aspirin: no Recurrent headaches: no Visual changes: no Palpitations: no Dyspnea: no Chest pain: no Lower extremity edema: no Dizzy/lightheaded: no  Relevant past medical, surgical, family and social history reviewed and updated as indicated. Interim medical history since our last visit reviewed. Allergies and medications reviewed and updated.  Review of Systems  Eyes:  Negative for visual disturbance.  Respiratory:  Negative for cough, chest tightness and shortness of breath.   Cardiovascular:  Negative for chest pain, palpitations and leg swelling.  Neurological:  Negative for dizziness and headaches.    Per HPI unless specifically indicated above     Objective:    BP 135/75 (BP Location: Left Arm, Cuff Size: Normal)   Pulse 75   Temp 98 F (36.7 C) (Oral)   Ht 4' 10"  (1.473 m)   Wt 116 lb (52.6 kg)   SpO2 98%   BMI 24.24 kg/m   Wt Readings from Last 3 Encounters:  11/23/21 116 lb (52.6 kg)  07/04/21 120 lb (54.4 kg)  06/20/21 119 lb 6.4 oz (54.2 kg)    Physical  Exam Vitals and nursing note reviewed.  Constitutional:      General: She is not in acute distress.    Appearance: Normal appearance. She is normal weight. She is not ill-appearing, toxic-appearing or diaphoretic.  HENT:     Head: Normocephalic.     Right Ear: External ear normal.     Left Ear: External ear normal.     Nose: Nose normal.     Mouth/Throat:     Mouth: Mucous membranes are moist.     Pharynx: Oropharynx is clear.  Eyes:     General:        Right eye: No discharge.        Left eye: No discharge.     Extraocular Movements: Extraocular movements intact.     Conjunctiva/sclera: Conjunctivae normal.     Pupils: Pupils are equal, round, and reactive to light.  Cardiovascular:     Rate and Rhythm: Normal rate and regular rhythm.     Heart sounds: No murmur heard. Pulmonary:     Effort: Pulmonary effort is normal. No respiratory distress.     Breath sounds: Normal breath sounds. No wheezing or rales.  Musculoskeletal:     Cervical back: Normal range of motion and neck supple.  Skin:    General: Skin is warm and dry.     Capillary Refill: Capillary refill takes less  than 2 seconds.  Neurological:     General: No focal deficit present.     Mental Status: She is alert and oriented to person, place, and time. Mental status is at baseline.  Psychiatric:        Mood and Affect: Mood normal.        Behavior: Behavior normal.        Thought Content: Thought content normal.        Judgment: Judgment normal.     Results for orders placed or performed in visit on 07/04/21  NASH FibroSURE  Result Value Ref Range   Fibrosis Score 0.77 (H) 0.00 - 0.21   Fibrosis Stage Comment    Steatosis Score 0.74 (H) 0.00 - 0.30   Steatosis Grade Comment    NASH Score 0.75 (H) 0.25   NASH Grade Comment    Height 58 in   Weight 120 LBS   ALPHA 2-MACROGLOBULINS, QN 430 (H) 110 - 276 mg/dL   Haptoglobin 150 37 - 355 mg/dL   Apolipoprotein A-1 158 116 - 209 mg/dL   Bilirubin, Total 0.6  0.0 - 1.2 mg/dL   GGT 305 (H) 0 - 60 IU/L   ALT (SGPT) P5P 225 (H) 0 - 40 IU/L   AST (SGOT) P5P 212 (H) 0 - 40 IU/L   Cholesterol, Total 139 100 - 199 mg/dL   Glucose 121 (H) 70 - 99 mg/dL   Triglycerides 84 0 - 149 mg/dL   Interpretations Comment    Fibrosis Scoring: Comment    Steatosis Grading Comment    NASH Scoring Comment    Limitations Comment    Comment Comment       Assessment & Plan:   Problem List Items Addressed This Visit       Cardiovascular and Mediastinum   Primary hypertension - Primary    Chronic.  Uncontrolled.  Will increase Lisinopril to 71m daily.  Return to clinic in 1 months for reevaluation.  Call sooner if concerns arise.        Relevant Medications   lisinopril (ZESTRIL) 20 MG tablet   Other Relevant Orders   Comp Met (CMET)     Follow up plan: Return in about 6 weeks (around 01/04/2022) for BP Check.

## 2021-11-23 NOTE — Assessment & Plan Note (Signed)
Chronic.  Uncontrolled.  Will increase Lisinopril to '10mg'$  daily.  Return to clinic in 1 months for reevaluation.  Call sooner if concerns arise.

## 2021-11-27 ENCOUNTER — Other Ambulatory Visit: Payer: Self-pay

## 2021-11-27 NOTE — Telephone Encounter (Signed)
Med refill request for valacyclovir. Pt last seen in office on  11/23/21. Next office visit scheduled for 01/04/2022. Last filled 02/11/2021

## 2021-11-28 MED ORDER — VALACYCLOVIR HCL 1 G PO TABS: 1000.0000 mg | ORAL_TABLET | Freq: Two times a day (BID) | ORAL | 1 refills | Status: DC

## 2022-01-01 ENCOUNTER — Ambulatory Visit: Payer: BC Managed Care – PPO | Admitting: Nurse Practitioner

## 2022-01-01 ENCOUNTER — Encounter: Payer: Self-pay | Admitting: Nurse Practitioner

## 2022-01-01 VITALS — BP 149/74 | HR 72 | Temp 98.4°F

## 2022-01-01 DIAGNOSIS — I10 Essential (primary) hypertension: Secondary | ICD-10-CM | POA: Diagnosis not present

## 2022-01-01 DIAGNOSIS — Z23 Encounter for immunization: Secondary | ICD-10-CM

## 2022-01-01 MED ORDER — LISINOPRIL 20 MG PO TABS: 20.0000 mg | ORAL_TABLET | Freq: Every day | ORAL | 0 refills | Status: DC

## 2022-01-01 NOTE — Progress Notes (Signed)
BP (!) 149/74 (BP Location: Left Arm, Cuff Size: Normal)   Pulse 72   Temp 98.4 F (36.9 C)    Subjective:    Patient ID: Amanda Krause, female    DOB: [birth year], 61 y.o.   MRN: [id number]  HPI: Amanda Krause is a 61 y.o. female  Chief Complaint  Patient presents with   Hypertension   HYPERTENSION Hypertension status: uncontrolled Satisfied with current treatment? no Duration of hypertension: years BP monitoring frequency:  not checking BP range:  BP medication side effects:  no Medication compliance: excellent compliance Previous BP meds:lisinopril Aspirin: no Recurrent headaches: yes- does have dental issues going Visual changes: no Palpitations: no Dyspnea: no Chest pain: no Lower extremity edema: no Dizzy/lightheaded: no  Relevant past medical, surgical, family and social history reviewed and updated as indicated. Interim medical history since our last visit reviewed. Allergies and medications reviewed and updated.  Review of Systems  Eyes:  Negative for visual disturbance.  Respiratory:  Negative for cough, chest tightness and shortness of breath.   Cardiovascular:  Negative for chest pain, palpitations and leg swelling.  Neurological:  Positive for headaches. Negative for dizziness.    Per HPI unless specifically indicated above     Objective:    BP (!) 149/74 (BP Location: Left Arm, Cuff Size: Normal)   Pulse 72   Temp 98.4 F (36.9 C)   Wt Readings from Last 3 Encounters:  11/23/21 116 lb (52.6 kg)  07/04/21 120 lb (54.4 kg)  06/20/21 119 lb 6.4 oz (54.2 kg)    Physical Exam Vitals and nursing note reviewed.  Constitutional:      General: She is not in acute distress.    Appearance: Normal appearance. She is normal weight. She is not ill-appearing, toxic-appearing or diaphoretic.  HENT:     Head: Normocephalic.     Right Ear: External ear normal.     Left Ear: External ear normal.     Nose: Nose normal.     Mouth/Throat:     Mouth: Mucous  membranes are moist.     Pharynx: Oropharynx is clear.  Eyes:     General:        Right eye: No discharge.        Left eye: No discharge.     Extraocular Movements: Extraocular movements intact.     Conjunctiva/sclera: Conjunctivae normal.     Pupils: Pupils are equal, round, and reactive to light.  Cardiovascular:     Rate and Rhythm: Normal rate and regular rhythm.     Heart sounds: No murmur heard. Pulmonary:     Effort: Pulmonary effort is normal. No respiratory distress.     Breath sounds: Normal breath sounds. No wheezing or rales.  Musculoskeletal:     Cervical back: Normal range of motion and neck supple.  Skin:    General: Skin is warm and dry.     Capillary Refill: Capillary refill takes less than 2 seconds.  Neurological:     General: No focal deficit present.     Mental Status: She is alert and oriented to person, place, and time. Mental status is at baseline.  Psychiatric:        Mood and Affect: Mood normal.        Behavior: Behavior normal.        Thought Content: Thought content normal.        Judgment: Judgment normal.     Results for orders placed or performed in visit  on 07/04/21  NASH FibroSURE  Result Value Ref Range   Fibrosis Score 0.77 (H) 0.00 - 0.21   Fibrosis Stage Comment    Steatosis Score 0.74 (H) 0.00 - 0.30   Steatosis Grade Comment    NASH Score 0.75 (H) 0.25   NASH Grade Comment    Height 58 in   Weight 120 LBS   ALPHA 2-MACROGLOBULINS, QN 430 (H) 110 - 276 mg/dL   Haptoglobin 150 37 - 355 mg/dL   Apolipoprotein A-1 158 116 - 209 mg/dL   Bilirubin, Total 0.6 0.0 - 1.2 mg/dL   GGT 305 (H) 0 - 60 IU/L   ALT (SGPT) P5P 225 (H) 0 - 40 IU/L   AST (SGOT) P5P 212 (H) 0 - 40 IU/L   Cholesterol, Total 139 100 - 199 mg/dL   Glucose 121 (H) 70 - 99 mg/dL   Triglycerides 84 0 - 149 mg/dL   Interpretations Comment    Fibrosis Scoring: Comment    Steatosis Grading Comment    NASH Scoring Comment    Limitations Comment    Comment Comment        Assessment & Plan:   Problem List Items Addressed This Visit       Cardiovascular and Mediastinum   Primary hypertension - Primary    Chronic. Not well controlled.  Is having a good amount of pain due to dental issues.  Would like to wait until dental issues are resolved before going up on medication.  Has upcoming appointments with Dental Surgery.  Follow up in 1 month for reevaluation.  Call sooner if concerns arise.       Relevant Medications   lisinopril (ZESTRIL) 20 MG tablet   Other Visit Diagnoses     Need for influenza vaccination       Need for shingles vaccine       Relevant Orders   Flu Vaccine QUAD 6+ mos PF IM (Fluarix Quad PF)   Zoster Recombinant (Shingrix )        Follow up plan: Return in about 1 month (around 01/31/2022) for BP Check.

## 2022-01-01 NOTE — Assessment & Plan Note (Signed)
Chronic. Not well controlled.  Is having a good amount of pain due to dental issues.  Would like to wait until dental issues are resolved before going up on medication.  Has upcoming appointments with Dental Surgery.  Follow up in 1 month for reevaluation.  Call sooner if concerns arise.

## 2022-01-04 ENCOUNTER — Ambulatory Visit: Payer: BC Managed Care – PPO | Admitting: Nurse Practitioner

## 2022-01-23 ENCOUNTER — Other Ambulatory Visit: Payer: Self-pay | Admitting: Nurse Practitioner

## 2022-01-23 MED ORDER — ESCITALOPRAM OXALATE 20 MG PO TABS: 20.0000 mg | ORAL_TABLET | Freq: Every day | ORAL | 1 refills | Status: DC

## 2022-01-23 NOTE — Telephone Encounter (Signed)
Medication Refill - Medication: escitalopram (LEXAPRO) 20 MG tablet  Has the patient contacted their pharmacy? Yes.     Preferred Pharmacy (with phone number or street name):  [id number] [address], [place] Phone:  [phone]  Fax:  [phone]     Has the patient been seen for an appointment in the last year OR does the patient have an upcoming appointment? Yes.    Please assist patient further

## 2022-01-23 NOTE — Telephone Encounter (Signed)
Requested Prescriptions  Pending Prescriptions Disp Refills  . escitalopram (LEXAPRO) 20 MG tablet 90 tablet 1    Sig: Take 1 tablet (20 mg total) by mouth daily.     Psychiatry:  Antidepressants - SSRI Passed - 01/23/2022  1:04 PM      Passed - Completed PHQ-2 or PHQ-9 in the last 360 days      Passed - Valid encounter within last 6 months    Recent Outpatient Visits          3 weeks ago Primary hypertension   [facility] Jon Billings, NP   2 months ago Primary hypertension   [facility] Jon Billings, NP   7 months ago Primary hypertension   [facility] Jon Billings, NP   8 months ago Annual physical exam   [place], NP   10 months ago Depression, major, single episode, moderate ([place])   [place], Scheryl Darter, NP      Future Appointments            In 1 week Jon Billings, NP [facility], [place]

## 2022-02-01 ENCOUNTER — Ambulatory Visit: Payer: BC Managed Care – PPO | Admitting: Nurse Practitioner

## 2022-02-01 ENCOUNTER — Encounter: Payer: Self-pay | Admitting: Nurse Practitioner

## 2022-02-01 VITALS — BP 130/70 | HR 79 | Temp 98.4°F | Wt 115.8 lb

## 2022-02-01 DIAGNOSIS — Z1231 Encounter for screening mammogram for malignant neoplasm of breast: Secondary | ICD-10-CM

## 2022-02-01 DIAGNOSIS — I10 Essential (primary) hypertension: Secondary | ICD-10-CM

## 2022-02-01 MED ORDER — LISINOPRIL 20 MG PO TABS: 20.0000 mg | ORAL_TABLET | Freq: Every day | ORAL | 1 refills | Status: DC

## 2022-02-01 NOTE — Progress Notes (Signed)
BP (!) 145/73 (BP Location: Left Arm, Cuff Size: Normal)   Pulse 79   Temp 98.4 F (36.9 C) (Oral)   Wt 115 lb 12.8 oz (52.5 kg)   SpO2 95%   BMI 24.20 kg/m    Subjective:    Patient ID: Amanda Krause, female    DOB: 11/16/1960, 61 y.o.   MRN: [id number]  HPI: Amanda Krause is a 61 y.o. female  Chief Complaint  Patient presents with   Hypertension   HYPERTENSION without Chronic Kidney Disease Hypertension status: controlled  Satisfied with current treatment? yes Duration of hypertension: years BP monitoring frequency:  daily BP range: 130/70 BP medication side effects:  no Medication compliance: excellent compliance Previous BP meds:lisinopril Aspirin: no Recurrent headaches: no Visual changes: no Palpitations: no Dyspnea: no Chest pain: no Lower extremity edema: no Dizzy/lightheaded: no  Relevant past medical, surgical, family and social history reviewed and updated as indicated. Interim medical history since our last visit reviewed. Allergies and medications reviewed and updated.  Review of Systems  Eyes:  Negative for visual disturbance.  Respiratory:  Negative for cough, chest tightness and shortness of breath.   Cardiovascular:  Negative for chest pain, palpitations and leg swelling.  Neurological:  Negative for dizziness and headaches.    Per HPI unless specifically indicated above     Objective:    BP (!) 145/73 (BP Location: Left Arm, Cuff Size: Normal)   Pulse 79   Temp 98.4 F (36.9 C) (Oral)   Wt 115 lb 12.8 oz (52.5 kg)   SpO2 95%   BMI 24.20 kg/m   Wt Readings from Last 3 Encounters:  02/01/22 115 lb 12.8 oz (52.5 kg)  11/23/21 116 lb (52.6 kg)  07/04/21 120 lb (54.4 kg)    Physical Exam Vitals and nursing note reviewed.  Constitutional:      General: She is not in acute distress.    Appearance: Normal appearance. She is normal weight. She is not ill-appearing, toxic-appearing or diaphoretic.  HENT:     Head: Normocephalic.     Right  Ear: External ear normal.     Left Ear: External ear normal.     Nose: Nose normal.     Mouth/Throat:     Mouth: Mucous membranes are moist.     Pharynx: Oropharynx is clear.  Eyes:     General:        Right eye: No discharge.        Left eye: No discharge.     Extraocular Movements: Extraocular movements intact.     Conjunctiva/sclera: Conjunctivae normal.     Pupils: Pupils are equal, round, and reactive to light.  Cardiovascular:     Rate and Rhythm: Normal rate and regular rhythm.     Heart sounds: No murmur heard. Pulmonary:     Effort: Pulmonary effort is normal. No respiratory distress.     Breath sounds: Normal breath sounds. No wheezing or rales.  Musculoskeletal:     Cervical back: Normal range of motion and neck supple.  Skin:    General: Skin is warm and dry.     Capillary Refill: Capillary refill takes less than 2 seconds.  Neurological:     General: No focal deficit present.     Mental Status: She is alert and oriented to person, place, and time. Mental status is at baseline.  Psychiatric:        Mood and Affect: Mood normal.        Behavior:  Behavior normal.        Thought Content: Thought content normal.        Judgment: Judgment normal.     Results for orders placed or performed in visit on 07/04/21  NASH FibroSURE  Result Value Ref Range   Fibrosis Score 0.77 (H) 0.00 - 0.21   Fibrosis Stage Comment    Steatosis Score 0.74 (H) 0.00 - 0.30   Steatosis Grade Comment    NASH Score 0.75 (H) 0.25   NASH Grade Comment    Height 58 in   Weight 120 LBS   ALPHA 2-MACROGLOBULINS, QN 430 (H) 110 - 276 mg/dL   Haptoglobin 150 37 - 355 mg/dL   Apolipoprotein A-1 158 116 - 209 mg/dL   Bilirubin, Total 0.6 0.0 - 1.2 mg/dL   GGT 305 (H) 0 - 60 IU/L   ALT (SGPT) P5P 225 (H) 0 - 40 IU/L   AST (SGOT) P5P 212 (H) 0 - 40 IU/L   Cholesterol, Total 139 100 - 199 mg/dL   Glucose 121 (H) 70 - 99 mg/dL   Triglycerides 84 0 - 149 mg/dL   Interpretations Comment     Fibrosis Scoring: Comment    Steatosis Grading Comment    NASH Scoring Comment    Limitations Comment    Comment Comment       Assessment & Plan:   Problem List Items Addressed This Visit       Cardiovascular and Mediastinum   Primary hypertension - Primary     Follow up plan: No follow-ups on file.

## 2022-02-01 NOTE — Assessment & Plan Note (Signed)
Chronic.  Controlled.  Continue with current medication regimen of Lisinopril '20mg'$  daily.  Refill sent today. Return to clinic in 3 months for reevaluation.  Call sooner if concerns arise.

## 2022-02-07 ENCOUNTER — Other Ambulatory Visit: Payer: Self-pay | Admitting: Nurse Practitioner

## 2022-02-07 ENCOUNTER — Telehealth: Payer: Self-pay

## 2022-02-07 DIAGNOSIS — N6489 Other specified disorders of breast: Secondary | ICD-10-CM

## 2022-02-07 DIAGNOSIS — Z1231 Encounter for screening mammogram for malignant neoplasm of breast: Secondary | ICD-10-CM

## 2022-02-07 NOTE — Telephone Encounter (Signed)
-----   Message from Jon Billings, NP sent at 02/01/2022  3:26 PM EDT ----- Can we call and schedule her mammo for January.

## 2022-02-07 NOTE — Telephone Encounter (Signed)
Pt returned Brittany's call. Pt stated as late in the day as possible, and any day of the week is fine.

## 2022-02-07 NOTE — Telephone Encounter (Signed)
Called and LVM asking for patient to please return my call. Need to know if the patient has a day or time of day preference to have her mammogram scheduled for.   OK for PEC to speak to patient and find out the above information if she calls back.

## 2022-02-07 NOTE — Telephone Encounter (Signed)
Mammogram scheduled for 05/13/22 at 4:40 pm. Called and LVM notifying patient of appointment date and time. Also advised patient that the appointment is listed on her mychart as well.

## 2022-03-04 ENCOUNTER — Ambulatory Visit: Payer: Self-pay | Admitting: *Deleted

## 2022-03-04 NOTE — Telephone Encounter (Signed)
  Chief Complaint: right ear pain swollen gland Symptoms: mild headache, runny nose mild, right ear pain with swollen gland no drainage,wakes up at night . Frequency: 1 week Pertinent Negatives: Patient denies fever, no drainage from ear  Disposition: '[]'$ ED /'[]'$ Urgent Care (no appt availability in office) / '[x]'$ Appointment(In office/virtual)/ '[]'$  [facility]/ '[]'$ Home Care/ '[]'$ Refused Recommended Disposition /'[]'$ [facility]/ '[]'$  Follow-up with PCP Additional Notes:   Appt 03/05/22     Reason for Disposition  Earache  (Exceptions: brief ear pain of < 60 minutes duration, earache occurring during air travel  Answer Assessment - Initial Assessment Questions 1. LOCATION: "Which ear is involved?"     Right ear gland swelling  2. ONSET: "When did the ear start hurting"      1 week  3. SEVERITY: "How bad is the pain?"  (Scale 1-10; mild, moderate or severe)   - MILD (1-3): doesn't interfere with normal activities    - MODERATE (4-7): interferes with normal activities or awakens from sleep    - SEVERE (8-10): excruciating pain, unable to do any normal activities      Waking up during the night  4. URI SYMPTOMS: "Do you have a runny nose or cough?"     Runny nose cough not bad 5. FEVER: "Do you have a fever?" If Yes, ask: "What is your temperature, how was it measured, and when did it start?"     Not sure  6. CAUSE: "Have you been swimming recently?", "How often do you use Q-TIPS?", "Have you had any recent air travel or scuba diving?"     no 7. OTHER SYMPTOMS: "Do you have any other symptoms?" (e.g., headache, stiff neck, dizziness, vomiting, runny nose, decreased hearing)     Ear gland swollen , headache runny nose  8. PREGNANCY: "Is there any chance you are pregnant?" "When was your last menstrual period?"     na  Protocols used: Bethann Punches

## 2022-03-04 NOTE — Progress Notes (Unsigned)
   There were no vitals taken for this visit.   Subjective:    Patient ID: Amanda Krause, female    DOB: [birth year], 61 y.o.   MRN: [id number]  HPI: Amanda Krause is a 61 y.o. female  No chief complaint on file.  EAR PAIN Duration: {Blank single:19197::"days","weeks","months"} Involved ear(s): {Blank single:19197::"left","right","bilateral"} Severity:  {Blank single:19197::"mild","moderate","severe","1/10","2/10","3/10","4/10","5/10","6/10","7/10","8/10","9/10","10/10"}  Quality:  {Blank multiple:19196::"sharp","dull","aching","burning","cramping","ill-defined","itchy","pressure-like","pulling","shooting","sore","stabbing","tender","tearing","throbbing"} Fever: {Blank single:19197::"yes","no"} Otorrhea: {Blank single:19197::"yes","no"} Upper respiratory infection symptoms: {Blank single:19197::"yes","no"} Pruritus: {Blank single:19197::"yes","no"} Hearing loss: {Blank single:19197::"yes","no"} Water immersion {Blank single:19197::"yes","no"} Using Q-tips: {Blank single:19197::"yes","no"} Recurrent otitis media: {Blank single:19197::"yes","no"} Status: {Blank multiple:19196::"better","worse","stable","fluctuating"} Treatments attempted: {Blank single:19197::"none","pseudoephedrine"}  Relevant past medical, surgical, family and social history reviewed and updated as indicated. Interim medical history since our last visit reviewed. Allergies and medications reviewed and updated.  Review of Systems  Per HPI unless specifically indicated above     Objective:    There were no vitals taken for this visit.  Wt Readings from Last 3 Encounters:  02/01/22 115 lb 12.8 oz (52.5 kg)  11/23/21 116 lb (52.6 kg)  07/04/21 120 lb (54.4 kg)    Physical Exam  Results for orders placed or performed in visit on 07/04/21  NASH FibroSURE  Result Value Ref Range   Fibrosis Score 0.77 (H) 0.00 - 0.21   Fibrosis Stage Comment    Steatosis Score 0.74 (H) 0.00 - 0.30   Steatosis Grade Comment     NASH Score 0.75 (H) 0.25   NASH Grade Comment    Height 58 in   Weight 120 LBS   ALPHA 2-MACROGLOBULINS, QN 430 (H) 110 - 276 mg/dL   Haptoglobin 150 37 - 355 mg/dL   Apolipoprotein A-1 158 116 - 209 mg/dL   Bilirubin, Total 0.6 0.0 - 1.2 mg/dL   GGT 305 (H) 0 - 60 IU/L   ALT (SGPT) P5P 225 (H) 0 - 40 IU/L   AST (SGOT) P5P 212 (H) 0 - 40 IU/L   Cholesterol, Total 139 100 - 199 mg/dL   Glucose 121 (H) 70 - 99 mg/dL   Triglycerides 84 0 - 149 mg/dL   Interpretations Comment    Fibrosis Scoring: Comment    Steatosis Grading Comment    NASH Scoring Comment    Limitations Comment    Comment Comment       Assessment & Plan:   Problem List Items Addressed This Visit   None    Follow up plan: No follow-ups on file.

## 2022-03-05 ENCOUNTER — Encounter: Payer: Self-pay | Admitting: Nurse Practitioner

## 2022-03-05 ENCOUNTER — Ambulatory Visit: Payer: BC Managed Care – PPO | Admitting: Nurse Practitioner

## 2022-03-05 VITALS — BP 128/67 | HR 79 | Temp 98.6°F | Wt 115.7 lb

## 2022-03-05 DIAGNOSIS — H6690 Otitis media, unspecified, unspecified ear: Secondary | ICD-10-CM | POA: Diagnosis not present

## 2022-03-05 MED ORDER — AMOXICILLIN 500 MG PO CAPS: 500.0000 mg | ORAL_CAPSULE | Freq: Two times a day (BID) | ORAL | 0 refills | Status: AC

## 2022-05-06 ENCOUNTER — Ambulatory Visit: Payer: BC Managed Care – PPO | Admitting: Nurse Practitioner

## 2022-05-23 DIAGNOSIS — Z720 Tobacco use: Secondary | ICD-10-CM | POA: Diagnosis not present

## 2022-06-12 ENCOUNTER — Ambulatory Visit: Payer: BC Managed Care – PPO | Admitting: Nurse Practitioner

## 2022-06-13 ENCOUNTER — Encounter: Payer: Self-pay | Admitting: Physician Assistant

## 2022-06-13 ENCOUNTER — Ambulatory Visit: Payer: BC Managed Care – PPO | Admitting: Physician Assistant

## 2022-06-13 VITALS — BP 155/84 | HR 80 | Temp 98.2°F | Ht <= 58 in | Wt 119.4 lb

## 2022-06-13 DIAGNOSIS — I10 Essential (primary) hypertension: Secondary | ICD-10-CM

## 2022-06-13 DIAGNOSIS — H00014 Hordeolum externum left upper eyelid: Secondary | ICD-10-CM | POA: Diagnosis not present

## 2022-06-13 DIAGNOSIS — Z636 Dependent relative needing care at home: Secondary | ICD-10-CM | POA: Diagnosis not present

## 2022-06-13 DIAGNOSIS — F321 Major depressive disorder, single episode, moderate: Secondary | ICD-10-CM | POA: Diagnosis not present

## 2022-06-13 MED ORDER — BUPROPION HCL ER (XL) 150 MG PO TB24: 150.0000 mg | ORAL_TABLET | Freq: Every day | ORAL | 1 refills | Status: DC

## 2022-06-13 MED ORDER — LISINOPRIL 30 MG PO TABS: 30.0000 mg | ORAL_TABLET | Freq: Every day | ORAL | 0 refills | Status: DC

## 2022-06-13 NOTE — Patient Instructions (Addendum)
  [facility]  Personal Dementia Care   I recommend checking this website for help with caring for your mother and tips for managing caregiver stress

## 2022-06-13 NOTE — Progress Notes (Signed)
Acute Office Visit   Patient: Amanda Krause   DOB: 05-28-1960   62 y.o. Female  MRN: [id number] Visit Date: 06/13/2022  Today's healthcare provider: Dani Gobble Zerrick Hanssen, PA-C  Introduced myself to the patient as a Journalist, newspaper and provided education on APPs in clinical practice.    Chief Complaint  Patient presents with   Stye    Left eye, has had for about 2 weeks   Hypertension   Subjective    Hypertension Associated symptoms include headaches. Pertinent negatives include no chest pain or palpitations.   HPI     Stye    Additional comments: Left eye, has had for about 2 weeks      Last edited by Jerelene Redden, CMA on 06/13/2022  2:31 PM.        Hypertension: - Medications: Lisinopril 20 mg PO QD  - Compliance: excellent  - Checking BP at home: no she has not been checking it at home and thinks it has been elevated there  - Denies any SOB, CP, vision changes, LE edema, medication SEs, or symptoms of hypotension Reports she has been getting intermittent dull headaches   Stye of left eye Onset: gradual  Duration: about 2 weeks  Location: left upper eyelid  Associated symptoms: reports eyelids are stuck together in the AM. States she has noticed swelling in the AM along her upper eyelid   Interventions: warm towels and boric acid solution     Mood concerns  She reports increased stress and irritability at home  She is taking care of her 29 yo mother and reports increased anger at home She states she is fine at work but home life is a struggle She has been taking Lexapro 20 mg PO QD for almost a year - she thinks it helps her mostly during the day but when she gets home she has a lot of frustration and anger      06/13/2022    2:37 PM 03/05/2022    4:18 PM 02/01/2022    3:43 PM 01/01/2022    2:46 PM 11/23/2021    2:40 PM  Depression screen PHQ 2/9  Decreased Interest '2 1 1 1 1  '$ Down, Depressed, Hopeless '3 1 1 1 1  '$ PHQ - 2 Score '5 2 2 2 2  '$ Altered sleeping '1 2  2 1 1  '$ Tired, decreased energy '2 1 3 2 2  '$ Change in appetite 1 1 0 1 0  Feeling bad or failure about yourself  '1 2 1 1 1  '$ Trouble concentrating '1 1 1 1 1  '$ Moving slowly or fidgety/restless 0 '1 1 1 '$ 0  Suicidal thoughts '2 1 1 1 '$ 0  PHQ-9 Score '13 11 11 10 7  '$ Difficult doing work/chores Somewhat difficult Somewhat difficult Somewhat difficult Somewhat difficult Somewhat difficult      06/13/2022    2:37 PM 03/05/2022    4:18 PM 02/01/2022    3:43 PM 01/01/2022    2:47 PM  GAD 7 : Generalized Anxiety Score  Nervous, Anxious, on Edge '3 2 1 1  '$ Control/stop worrying '2 2 1 1  '$ Worry too much - different things '2 2 1 1  '$ Trouble relaxing '1 2 1 1  '$ Restless 1 1 0 0  Easily annoyed or irritable '3 3 1 1  '$ Afraid - awful might happen '1 2 1 1  '$ Total GAD 7 Score '13 14 6 6  '$ Anxiety Difficulty Very difficult Somewhat  difficult Not difficult at all Somewhat difficult        Medications: Outpatient Medications Prior to Visit  Medication Sig   cholecalciferol (VITAMIN D3) 25 MCG (1000 UNIT) tablet Take 1,000 Units by mouth daily.   escitalopram (LEXAPRO) 20 MG tablet Take 1 tablet (20 mg total) by mouth daily.   nicotine (NICODERM CQ) 21 mg/24hr patch Place 1 patch (21 mg total) onto the skin daily.   valACYclovir (VALTREX) 1000 MG tablet Take 1 tablet (1,000 mg total) by mouth 2 (two) times daily.   [DISCONTINUED] lisinopril (ZESTRIL) 20 MG tablet Take 1 tablet (20 mg total) by mouth daily.   No facility-administered medications prior to visit.    Review of Systems  Eyes:  Negative for visual disturbance.  Respiratory:  Negative for chest tightness.   Cardiovascular:  Negative for chest pain and palpitations.  Neurological:  Positive for headaches. Negative for dizziness.  Psychiatric/Behavioral:  Positive for agitation and dysphoric mood. The patient is nervous/anxious.        Objective    BP (!) 155/84   Pulse 80   Temp 98.2 F (36.8 C) (Oral)   Ht 4' 9.99" (1.473 m)   Wt 119 lb  6.4 oz (54.2 kg)   SpO2 97%   BMI 24.96 kg/m    Physical Exam Vitals reviewed.  Constitutional:      General: She is awake.     Appearance: Normal appearance. She is well-developed and well-groomed.  HENT:     Head: Normocephalic and atraumatic.  Eyes:     General: Gaze aligned appropriately. No allergic shiner.       Right eye: No foreign body, discharge or hordeolum.        Left eye: Hordeolum present.No foreign body or discharge.     Extraocular Movements: Extraocular movements intact.     Conjunctiva/sclera: Conjunctivae normal.     Pupils: Pupils are equal, round, and reactive to light.  Pulmonary:     Effort: Pulmonary effort is normal.     Breath sounds: Normal breath sounds.  Musculoskeletal:     Cervical back: Normal range of motion.  Neurological:     General: No focal deficit present.     Mental Status: She is alert and oriented to person, place, and time. Mental status is at baseline.  Psychiatric:        Attention and Perception: Attention and perception normal.        Mood and Affect: Mood and affect normal.        Speech: Speech normal.        Behavior: Behavior normal. Behavior is cooperative.        Thought Content: Thought content normal.        Judgment: Judgment normal.       No results found for any visits on 06/13/22.  Assessment & Plan      Return in about 6 weeks (around 07/25/2022) for Mood concerns, HTN med change .      Problem List Items Addressed This Visit       Cardiovascular and Mediastinum   Primary hypertension - Primary    Chronic, historic condition, appears exacerbated at this time BP in office is elevated and she reports feeling like it has been higher at home  She states she has been under increased stress with caring for her elderly mother  Will increase Lisinopril to 30 mg PO QD for further management  Recommend she monitor BP at home and return in 4 weeks  to assess response to medications       Relevant Medications    lisinopril (ZESTRIL) 30 MG tablet     Other   Depression, major, single episode, moderate (HCC)    Chronic, ongoing, Appears exacerbated by caring for her elderly mother with dementia She is taking Lexapro 20 mg PO QD and reports this helps her stay level and even during the day but she struggles at home  Will add Wellbutrin 150 mg PO QD at this time as she is expressing significant anxiety symptoms and desire to stop smoking  Recommend therapy services but will discuss this at follow up if patient is open to it Follow up in about 4-6 weeks to discuss response to medication changes       Relevant Medications   buPROPion (WELLBUTRIN XL) 150 MG 24 hr tablet   Caregiver stress    Patient is caring for her 64 yo mother who, she reports, has dementia  She reports significant stress with caring for her mother along with irritability and frustration  Suspect this is impacting overall mood and mental health status  Recommend continued follow up to monitor and provide resources       Other Visit Diagnoses     Hordeolum externum of left upper eyelid     Acute, ongoing She reports she has had the stye on her left upper eyelid for about 2 weeks and it is not improving with home measures of warm compresses and gentle massage Recommend she continue home measures and will refer to Ophthalmology for evaluation given prolonged duration  Follow up as needed after specialty apt    Relevant Orders   Ambulatory referral to Ophthalmology        Return in about 6 weeks (around 07/25/2022) for Mood concerns, HTN med change .   I, Marveen Donlon E Darria Corvera, PA-C, have reviewed all documentation for this visit. The documentation on 06/14/22 for the exam, diagnosis, procedures, and orders are all accurate and complete.   Talitha Givens, MHS, PA-C [facility]

## 2022-06-14 DIAGNOSIS — Z636 Dependent relative needing care at home: Secondary | ICD-10-CM | POA: Insufficient documentation

## 2022-06-14 NOTE — Assessment & Plan Note (Signed)
Chronic, historic condition, appears exacerbated at this time BP in office is elevated and she reports feeling like it has been higher at home  She states she has been under increased stress with caring for her elderly mother  Will increase Lisinopril to 30 mg PO QD for further management  Recommend she monitor BP at home and return in 4 weeks to assess response to medications

## 2022-06-14 NOTE — Assessment & Plan Note (Signed)
Patient is caring for her 63 yo mother who, she reports, has dementia  She reports significant stress with caring for her mother along with irritability and frustration  Suspect this is impacting overall mood and mental health status  Recommend continued follow up to monitor and provide resources

## 2022-06-14 NOTE — Assessment & Plan Note (Signed)
Chronic, ongoing, Appears exacerbated by caring for her elderly mother with dementia She is taking Lexapro 20 mg PO QD and reports this helps her stay level and even during the day but she struggles at home  Will add Wellbutrin 150 mg PO QD at this time as she is expressing significant anxiety symptoms and desire to stop smoking  Recommend therapy services but will discuss this at follow up if patient is open to it Follow up in about 4-6 weeks to discuss response to medication changes

## 2022-06-19 ENCOUNTER — Ambulatory Visit: Payer: BC Managed Care – PPO | Admitting: Nurse Practitioner

## 2022-06-25 ENCOUNTER — Ambulatory Visit
Admission: RE | Admit: 2022-06-25 | Discharge: 2022-06-25 | Disposition: A | Discharge status: Home / Self Care | Payer: BC Managed Care – PPO | Source: Ambulatory Visit | Attending: Nurse Practitioner | Admitting: Nurse Practitioner

## 2022-06-25 DIAGNOSIS — Z1231 Encounter for screening mammogram for malignant neoplasm of breast: Secondary | ICD-10-CM

## 2022-06-27 NOTE — Progress Notes (Signed)
Please let patient know her Mammogram did not show any evidence of a malignancy.  The recommendation is to repeat the Mammogram in 1 year.  

## 2022-07-25 ENCOUNTER — Ambulatory Visit: Payer: BC Managed Care – PPO | Admitting: Nurse Practitioner

## 2022-08-14 ENCOUNTER — Other Ambulatory Visit: Payer: Self-pay | Admitting: Nurse Practitioner

## 2022-08-14 ENCOUNTER — Other Ambulatory Visit: Payer: Self-pay | Admitting: Physician Assistant

## 2022-08-14 DIAGNOSIS — I10 Essential (primary) hypertension: Secondary | ICD-10-CM

## 2022-08-14 NOTE — Telephone Encounter (Signed)
Requested Prescriptions  Pending Prescriptions Disp Refills   lisinopril (ZESTRIL) 30 MG tablet [Pharmacy Med Name: LISINOPRIL  TABLETS] 90 tablet 0    Sig: TAKE 1 TABLET(30 MG) BY MOUTH DAILY     Cardiovascular:  ACE Inhibitors Failed - 08/14/2022  8:24 AM      Failed - Cr in normal range and within 180 days    Creatinine, Ser  Date Value Ref Range Status  05/22/2021 0.64 0.57 - 1.00 mg/dL Final         Failed - K in normal range and within 180 days    Potassium  Date Value Ref Range Status  05/22/2021 4.2 3.5 - 5.2 mmol/L Final         Failed - Last BP in normal range    BP Readings from Last 1 Encounters:  06/13/22 (!) 155/84         Passed - Patient is not pregnant      Passed - Valid encounter within last 6 months    Recent Outpatient Visits           2 months ago Primary hypertension   [place] [facility] Mecum, Erin E, PA-C   5 months ago Acute otitis media, unspecified otitis media type   [place] [facility] Larae Grooms, NP   6 months ago Primary hypertension   [place] [facility] Larae Grooms, NP   7 months ago Primary hypertension   [place] [facility] Larae Grooms, NP   8 months ago Primary hypertension   [place] [facility] Larae Grooms, NP       Future Appointments             Tomorrow Larae Grooms, NP [facility] [facility]

## 2022-08-14 NOTE — Telephone Encounter (Signed)
Requested Prescriptions  Pending Prescriptions Disp Refills   escitalopram (LEXAPRO) 20 MG tablet [Pharmacy Med Name: ESCITALOPRAM  TABLETS] 90 tablet 0    Sig: TAKE 1 TABLET(20 MG) BY MOUTH DAILY     Psychiatry:  Antidepressants - SSRI Passed - 08/14/2022  8:25 AM      Passed - Completed PHQ-2 or PHQ-9 in the last 360 days      Passed - Valid encounter within last 6 months    Recent Outpatient Visits           2 months ago Primary hypertension   [place] [facility] Mecum, Erin E, PA-C   5 months ago Acute otitis media, unspecified otitis media type   [place] [facility] Larae Grooms, NP   6 months ago Primary hypertension   [place] [facility] Larae Grooms, NP   7 months ago Primary hypertension   [place] [facility] Larae Grooms, NP   8 months ago Primary hypertension   [place] [facility] Larae Grooms, NP       Future Appointments             Tomorrow Larae Grooms, NP [place] [facility]

## 2022-08-15 ENCOUNTER — Ambulatory Visit: Payer: BC Managed Care – PPO | Admitting: Nurse Practitioner

## 2022-08-15 ENCOUNTER — Encounter: Payer: Self-pay | Admitting: Nurse Practitioner

## 2022-08-15 VITALS — BP 138/77 | HR 94 | Temp 98.7°F | Wt 118.4 lb

## 2022-08-15 DIAGNOSIS — I10 Essential (primary) hypertension: Secondary | ICD-10-CM | POA: Diagnosis not present

## 2022-08-15 DIAGNOSIS — R7309 Other abnormal glucose: Secondary | ICD-10-CM | POA: Diagnosis not present

## 2022-08-15 DIAGNOSIS — F321 Major depressive disorder, single episode, moderate: Secondary | ICD-10-CM

## 2022-08-15 MED ORDER — LISINOPRIL 30 MG PO TABS: ORAL_TABLET | ORAL | 1 refills | Status: DC

## 2022-08-15 MED ORDER — IBUPROFEN 800 MG PO TABS: 800.0000 mg | ORAL_TABLET | Freq: Three times a day (TID) | ORAL | 1 refills | Status: AC | PRN

## 2022-08-15 MED ORDER — BUSPIRONE HCL 10 MG PO TABS: 10.0000 mg | ORAL_TABLET | Freq: Two times a day (BID) | ORAL | 1 refills | Status: DC

## 2022-08-15 MED ORDER — ESCITALOPRAM OXALATE 20 MG PO TABS: ORAL_TABLET | ORAL | 1 refills | Status: DC

## 2022-08-15 NOTE — Assessment & Plan Note (Signed)
Chronic.  Depression is well controlled. Feels more agitated due to caring for her elderly mother.  Will try Buspar  PRN for anxiety.  Side effects and benefits of medication discussed during visit.  Follow up in 6 months. Call sooner if concerns arise.

## 2022-08-15 NOTE — Progress Notes (Signed)
BP 138/77 (BP Location: Left Arm, Cuff Size: Normal)   Pulse 94   Temp 98.7 F (37.1 C) (Oral)   Wt 118 lb 6.4 oz (53.7 kg)   SpO2 98%   BMI 24.75 kg/m    Subjective:    Patient ID: Amanda Krause, female    DOB: 22-Oct-1960, 62 y.o.   MRN: [id number]  HPI: Amanda Krause is a 62 y.o. female  Chief Complaint  Patient presents with   Depression   Hypertension   Hypertension: - Medications: Lisinopril 30 mg PO QD  - Compliance: excellent  - Checking BP at home: checks it occasionally but not regularly.   - Denies any SOB, CP, vision changes, LE edema, medication SEs, or symptoms of hypotension Reports she has been getting intermittent dull headaches    MOOD: Patient states she was started on Wellbutrin and didn't feel any different.  She feels more agitated which stems from her mom.  She feels like being able to take something as needed would be helpful.     Relevant past medical, surgical, family and social history reviewed and updated as indicated. Interim medical history since our last visit reviewed. Allergies and medications reviewed and updated.  Review of Systems  Eyes:  Negative for visual disturbance.  Respiratory:  Negative for cough, chest tightness and shortness of breath.   Cardiovascular:  Negative for chest pain, palpitations and leg swelling.  Neurological:  Negative for dizziness and headaches.  Psychiatric/Behavioral:  Positive for dysphoric mood. Negative for suicidal ideas. The patient is nervous/anxious.     Per HPI unless specifically indicated above     Objective:    BP 138/77 (BP Location: Left Arm, Cuff Size: Normal)   Pulse 94   Temp 98.7 F (37.1 C) (Oral)   Wt 118 lb 6.4 oz (53.7 kg)   SpO2 98%   BMI 24.75 kg/m   Wt Readings from Last 3 Encounters:  08/15/22 118 lb 6.4 oz (53.7 kg)  06/13/22 119 lb 6.4 oz (54.2 kg)  03/05/22 115 lb 11.2 oz (52.5 kg)    Physical Exam Vitals and nursing note reviewed.  Constitutional:      General:  She is not in acute distress.    Appearance: Normal appearance. She is normal weight. She is not ill-appearing, toxic-appearing or diaphoretic.  HENT:     Head: Normocephalic.     Right Ear: External ear normal.     Left Ear: External ear normal.     Nose: Nose normal.     Mouth/Throat:     Mouth: Mucous membranes are moist.     Pharynx: Oropharynx is clear.  Eyes:     General:        Right eye: No discharge.        Left eye: No discharge.     Extraocular Movements: Extraocular movements intact.     Conjunctiva/sclera: Conjunctivae normal.     Pupils: Pupils are equal, round, and reactive to light.  Cardiovascular:     Rate and Rhythm: Normal rate and regular rhythm.     Heart sounds: No murmur heard. Pulmonary:     Effort: Pulmonary effort is normal. No respiratory distress.     Breath sounds: Normal breath sounds. No wheezing or rales.  Musculoskeletal:     Cervical back: Normal range of motion and neck supple.  Skin:    General: Skin is warm and dry.     Capillary Refill: Capillary refill takes less than 2 seconds.  Neurological:     General: No focal deficit present.     Mental Status: She is alert and oriented to person, place, and time. Mental status is at baseline.  Psychiatric:        Mood and Affect: Mood normal.        Behavior: Behavior normal.        Thought Content: Thought content normal.        Judgment: Judgment normal.     Results for orders placed or performed in visit on 07/04/21  NASH FibroSURE  Result Value Ref Range   Fibrosis Score 0.77 (H) 0.00 - 0.21   Fibrosis Stage Comment    Steatosis Score 0.74 (H) 0.00 - 0.30   Steatosis Grade Comment    NASH Score 0.75 (H) 0.25   NASH Grade Comment    Height 58 in   Weight 120 LBS   ALPHA 2-MACROGLOBULINS, QN 430 (H) 110 - 276 mg/dL   Haptoglobin 657 37 - 355 mg/dL   Apolipoprotein A-1 846 116 - 209 mg/dL   Bilirubin, Total 0.6 0.0 - 1.2 mg/dL   GGT 962 (H) 0 - 60 IU/L   ALT (SGPT) P5P 225 (H) 0 -  40 IU/L   AST (SGOT) P5P 212 (H) 0 - 40 IU/L   Cholesterol, Total 139 100 - 199 mg/dL   Glucose 952 (H) 70 - 99 mg/dL   Triglycerides 84 0 - 149 mg/dL   Interpretations Comment    Fibrosis Scoring: Comment    Steatosis Grading Comment    NASH Scoring Comment    Limitations Comment    Comment Comment       Assessment & Plan:   Problem List Items Addressed This Visit       Cardiovascular and Mediastinum   Primary hypertension - Primary    Chronic.  Controlled.  Continue with current medication regimen of Lisinopril .  Labs ordered today.  Return to clinic in 6 months for reevaluation.  Call sooner if concerns arise.        Relevant Medications   lisinopril (ZESTRIL) 30 MG tablet   Other Relevant Orders   Comp Met (CMET)     Other   Depression, major, single episode, moderate    Chronic.  Depression is well controlled. Feels more agitated due to caring for her elderly mother.  Will try Buspar  PRN for anxiety.  Side effects and benefits of medication discussed during visit.  Follow up in 6 months. Call sooner if concerns arise.       Relevant Medications   busPIRone (BUSPAR) 10 MG tablet   escitalopram (LEXAPRO) 20 MG tablet   Other Visit Diagnoses     Elevated glucose       Labs ordered at visit today. WIll make recommendations based on lab results.   Relevant Orders   HgB A1c        Follow up plan: Return in about 6 months (around 02/14/2023) for Physical and Fasting labs.

## 2022-08-15 NOTE — Assessment & Plan Note (Signed)
Chronic.  Controlled.  Continue with current medication regimen of Lisinopril .  Labs ordered today.  Return to clinic in 6 months for reevaluation.  Call sooner if concerns arise.

## 2022-08-16 LAB — COMPREHENSIVE METABOLIC PANEL
ALT: 26 IU/L (ref 0–32)
AST: 30 IU/L (ref 0–40)
Albumin/Globulin Ratio: 1.6 (ref 1.2–2.2)
Albumin: 4.4 g/dL (ref 3.9–4.9)
Alkaline Phosphatase: 69 IU/L (ref 44–121)
BUN/Creatinine Ratio: 32 — ABNORMAL HIGH (ref 12–28)
BUN: 29 mg/dL — ABNORMAL HIGH (ref 8–27)
Bilirubin Total: 0.4 mg/dL (ref 0.0–1.2)
CO2: 23 mmol/L (ref 20–29)
Calcium: 10.6 mg/dL — ABNORMAL HIGH (ref 8.7–10.3)
Chloride: 104 mmol/L (ref 96–106)
Creatinine, Ser: 0.92 mg/dL (ref 0.57–1.00)
Globulin, Total: 2.8 g/dL (ref 1.5–4.5)
Glucose: 85 mg/dL (ref 70–99)
Potassium: 4.4 mmol/L (ref 3.5–5.2)
Sodium: 142 mmol/L (ref 134–144)
Total Protein: 7.2 g/dL (ref 6.0–8.5)
eGFR: 70 mL/min/{1.73_m2} (ref >59)

## 2022-08-16 LAB — HEMOGLOBIN A1C
Est. average glucose Bld gHb Est-mCnc: 111 mg/dL
Hgb A1c MFr Bld: 5.5 % (ref 4.8–5.6)

## 2022-08-16 NOTE — Progress Notes (Signed)
Hi Amanda Krause. It was nice to see you yesterday.  Your lab work looks good.  Your A1c is in the normal range.  No concerns about diabetes at this time.  No concerns at this time. Continue with your current medication regimen.  Follow up as discussed.  Please let me know if you have any questions.

## 2022-10-19 IMAGING — MG MM DIGITAL DIAGNOSTIC UNILAT*R* W/ TOMO W/ CAD
6 series · 6 of 18 positions shown · non-contrast
Comparison: Previous exam(s).

CLINICAL DATA: 61-year-old female presenting as a recall from
baseline screening for possible right breast asymmetry.

EXAM:
DIGITAL DIAGNOSTIC UNILATERAL RIGHT MAMMOGRAM WITH TOMOSYNTHESIS AND
CAD
TECHNIQUE: Right digital diagnostic mammography and breast tomosynthesis was
performed. The images were evaluated with computer-aided detection.

[R MLO synth-2D]
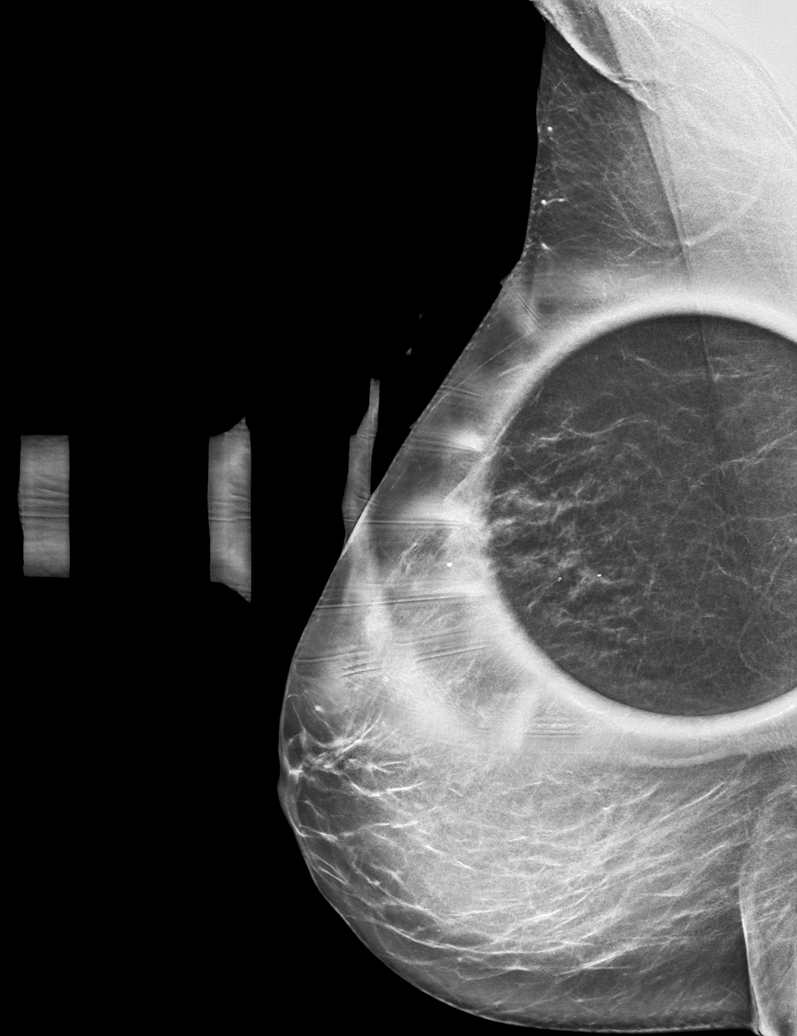

[R CC synth-2D (1 of 2)]
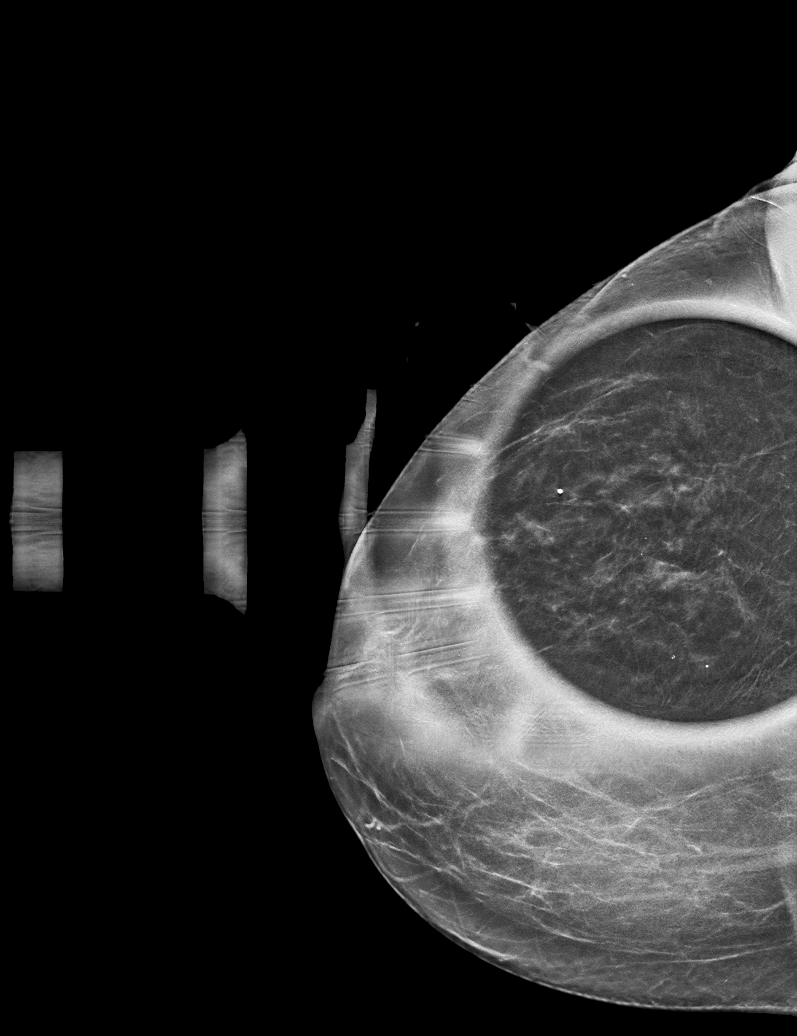

[R CC synth-2D (2 of 2)]
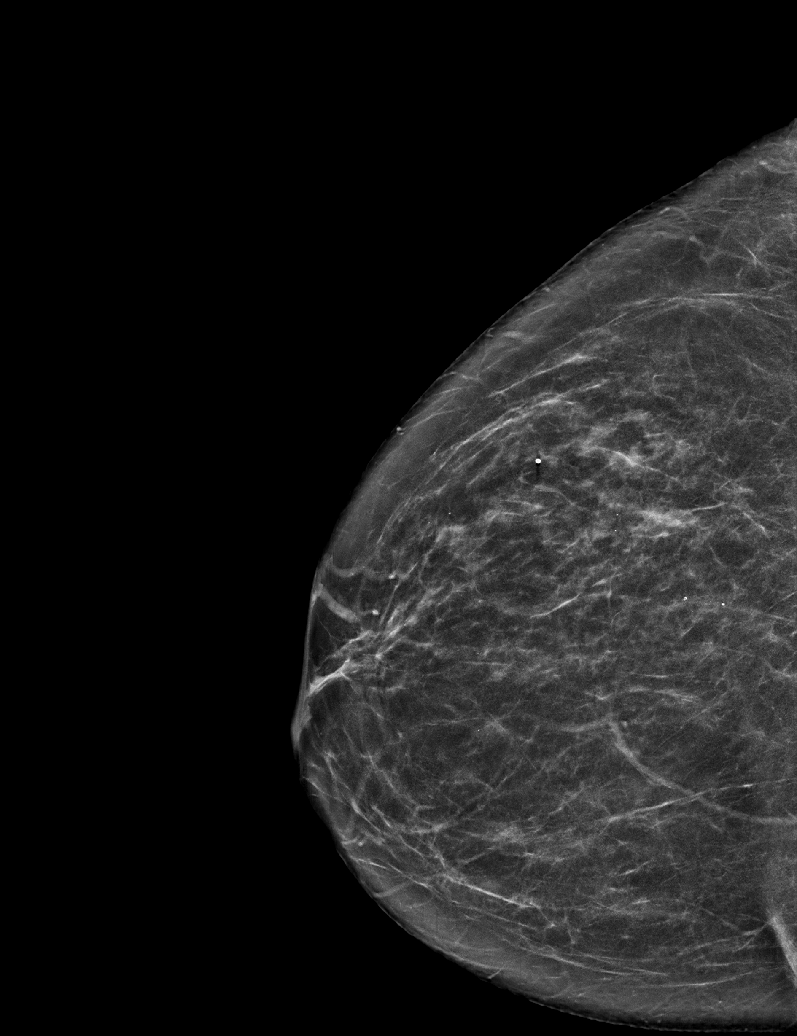

[R CC tomo (1 of 2) · tomo slice 29/56.0]
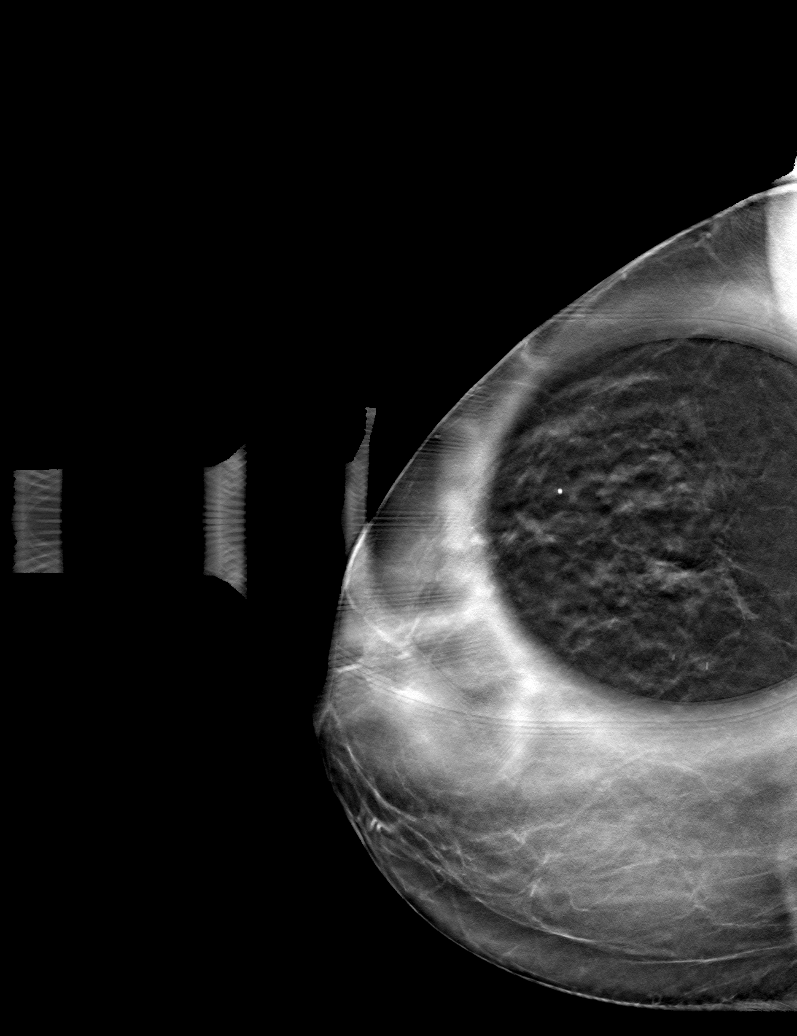

[R MLO tomo · tomo slice 26/51.0]
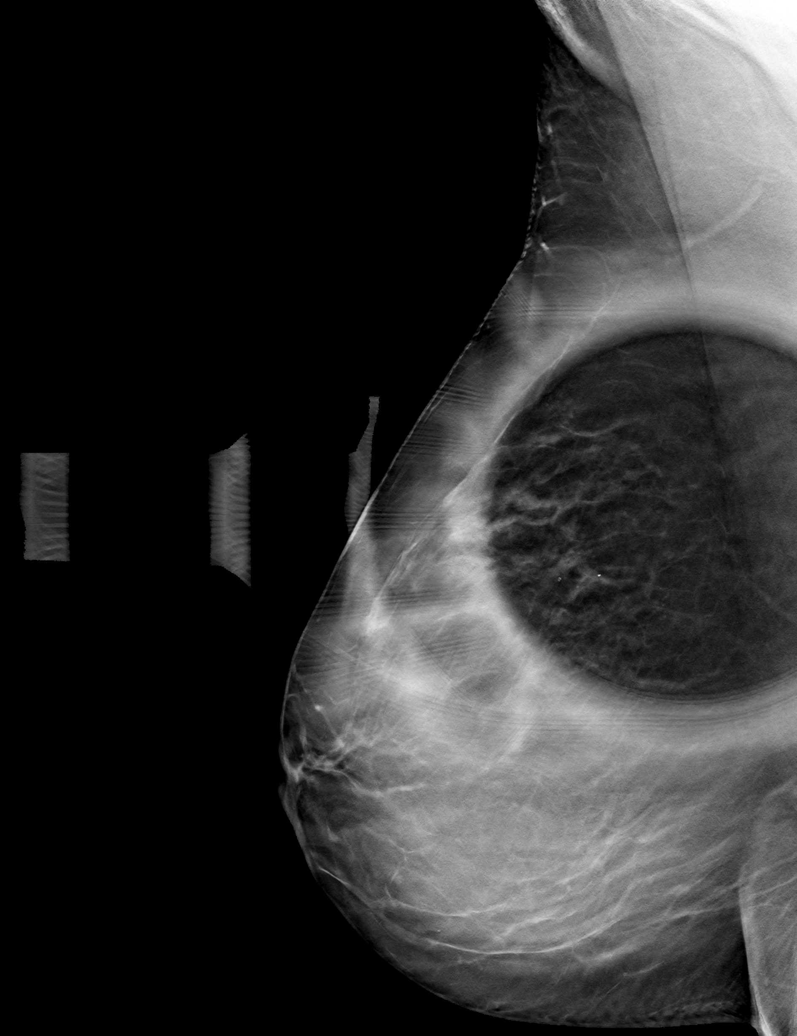

[R CC tomo (2 of 2) · tomo slice 31/62.0]
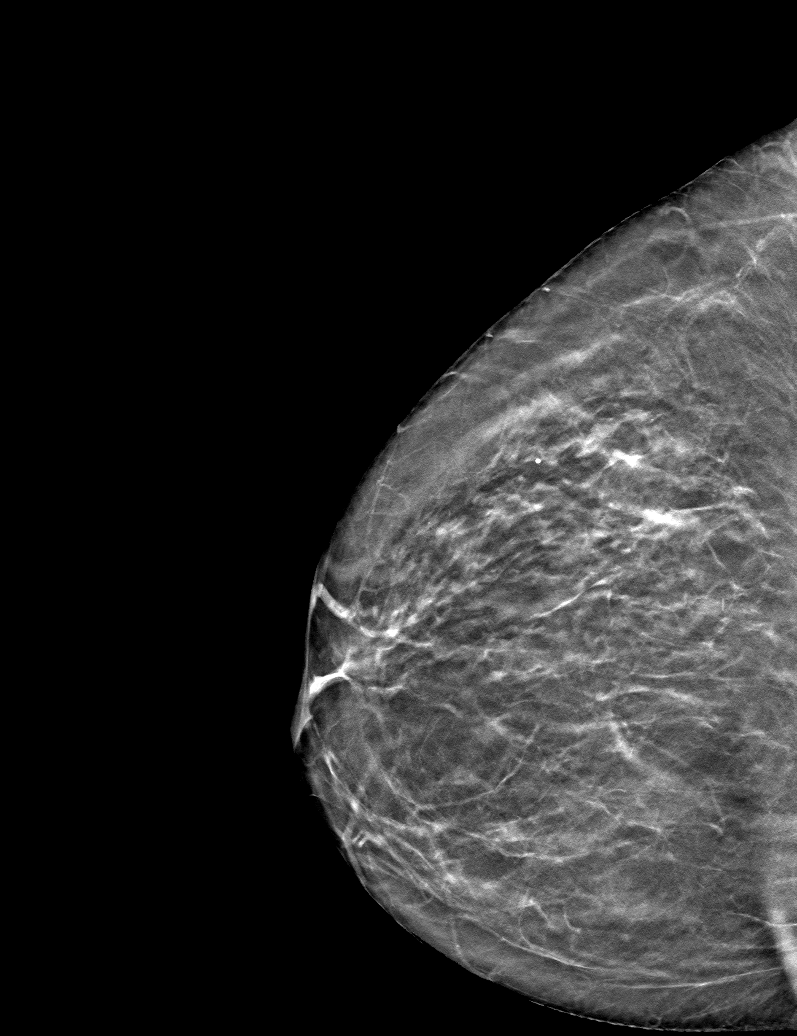

[6 of 18 positions shown; findings below may reference images not displayed]

ACR Breast Density Category b: There are scattered areas of
fibroglandular density.
FINDINGS: Full field cc tomosynthesis as well as spot compression
tomosynthesis CC and MLO views of the right breast were performed
for a questioned asymmetry in the upper outer right breast. On the
additional imaging the asymmetry effaces and appears most consistent
with normal fibroglandular tissue. There is no mass or distortion.
No new suspicious findings in the right breast.
IMPRESSION: No mammographic evidence of malignancy in the right breast.

RECOMMENDATION:
Screening mammogram in one year.(Code:04-8-TRK)

I have discussed the findings and recommendations with the patient.
If applicable, a reminder letter will be sent to the patient
regarding the next appointment.

BI-RADS CATEGORY  1: Negative.

## 2022-11-01 IMAGING — US US ABDOMEN LIMITED
1 series · 14 of 25 positions shown · non-contrast
Comparison: None.

CLINICAL DATA: Elevated liver enzymes.  Chronic hepatitis C.

EXAM:
ULTRASOUND ABDOMEN LIMITED RIGHT UPPER QUADRANT

[Series 1: us abdomen limited · 0.23mm/px · 14 of 53 slices shown]
[im 1/53]
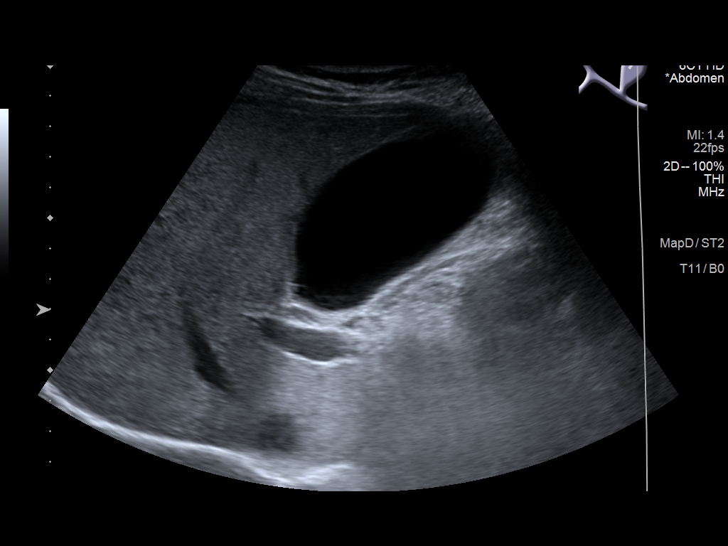
[im 5/53]
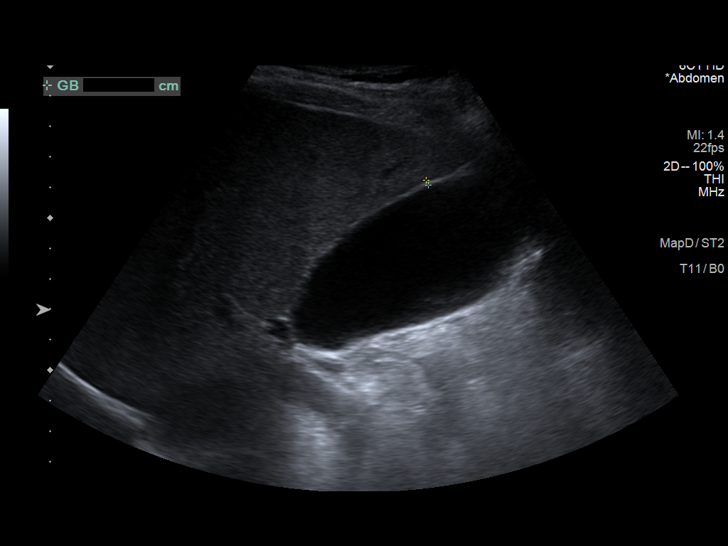
[im 9/53]
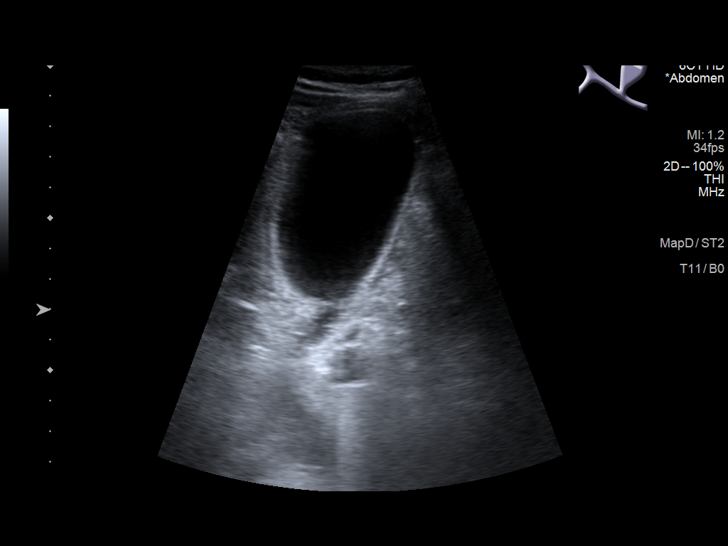
[im 14/53]
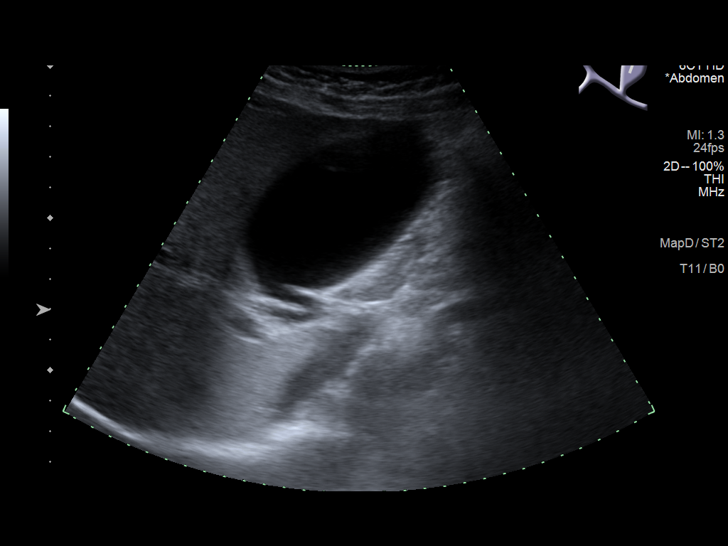
[im 18/53]
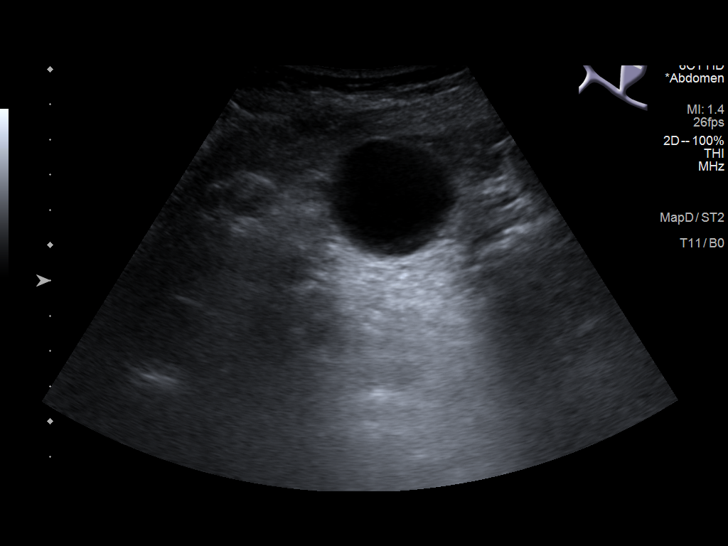
[im 20/53]
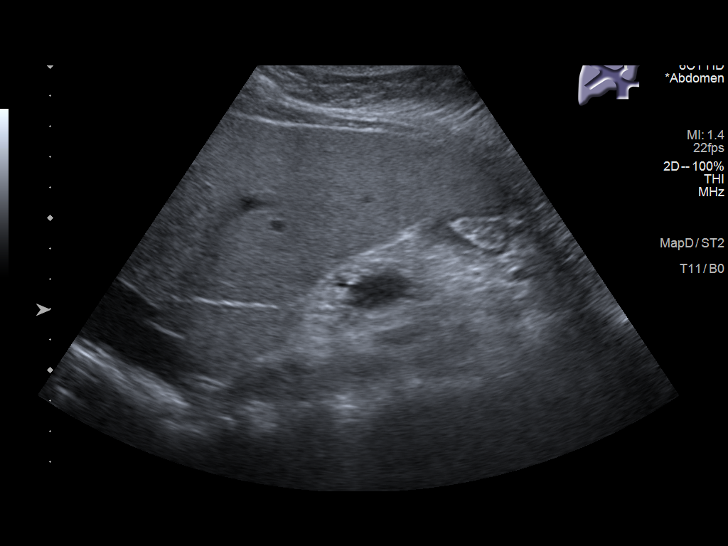
[im 24/53]
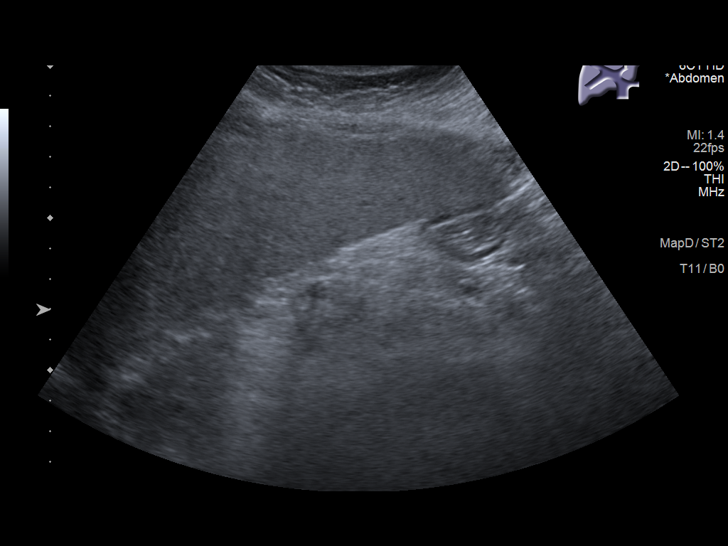
[im 29/53]
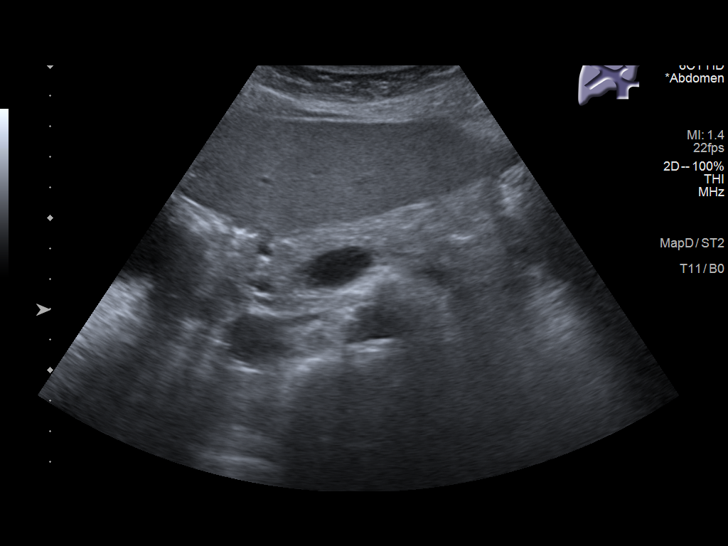
[im 33/53]
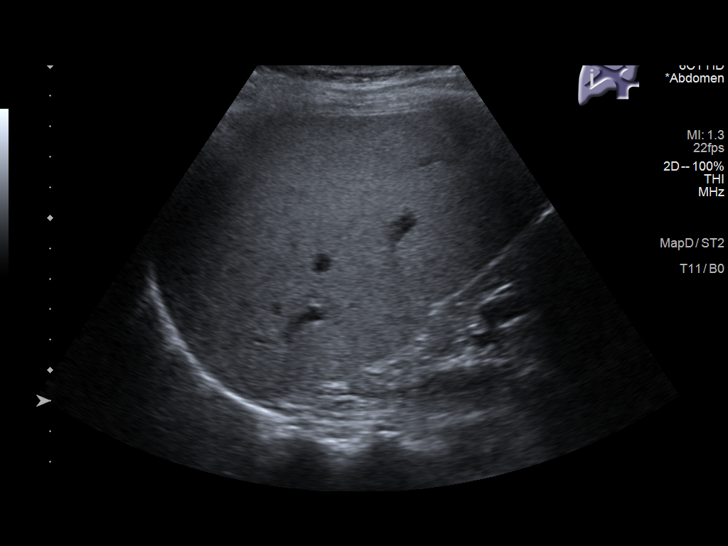
[im 35/53]
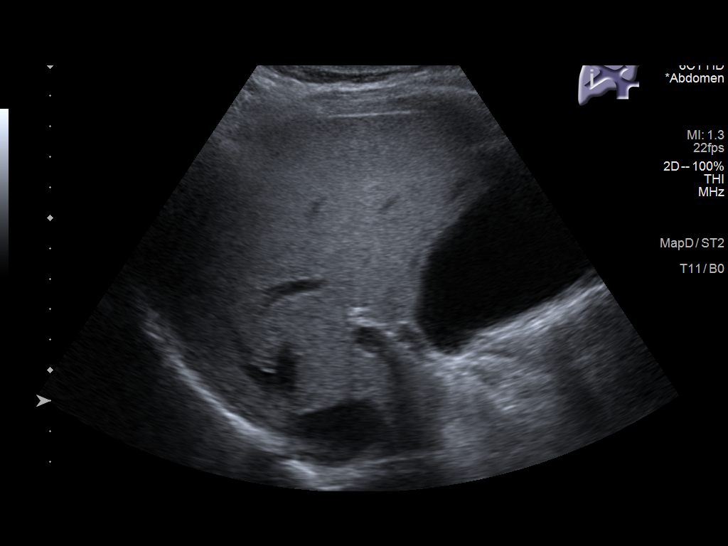
[im 40/53]
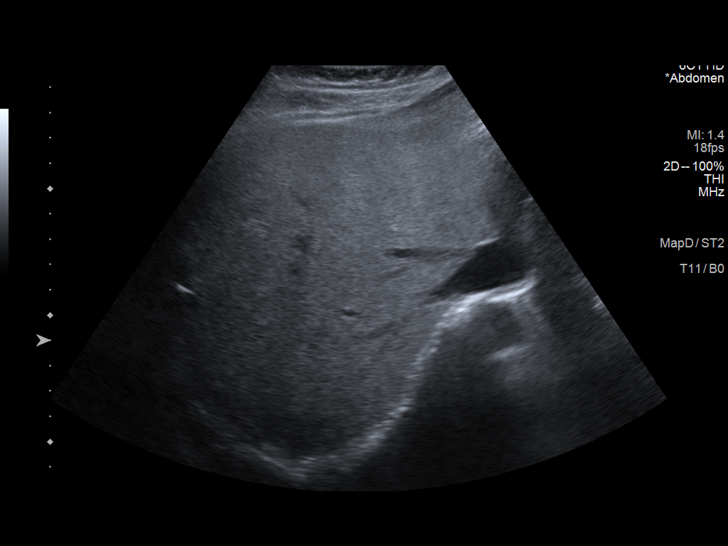
[im 44/53]
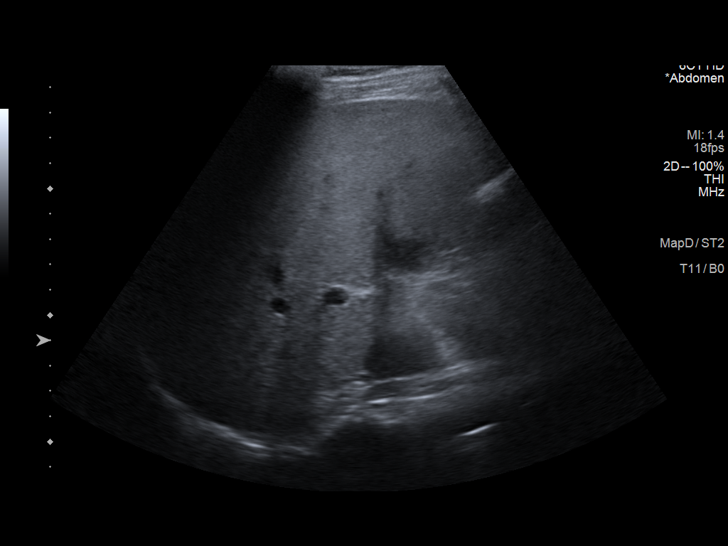
[im 48/53]
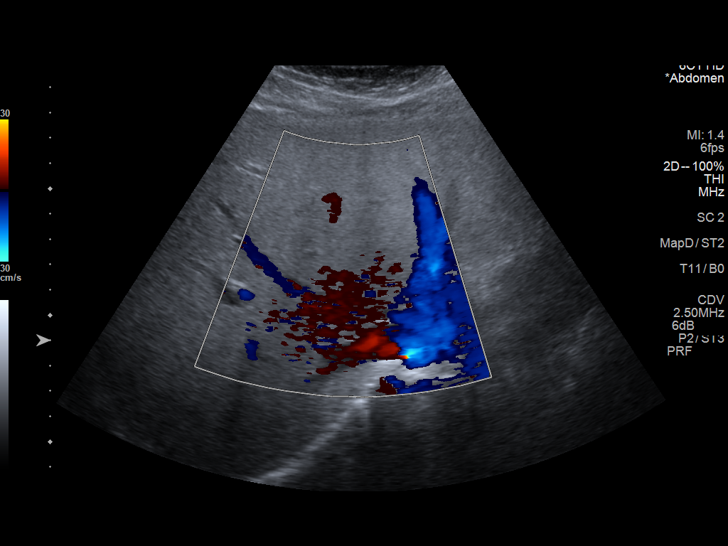
[im 53/53]
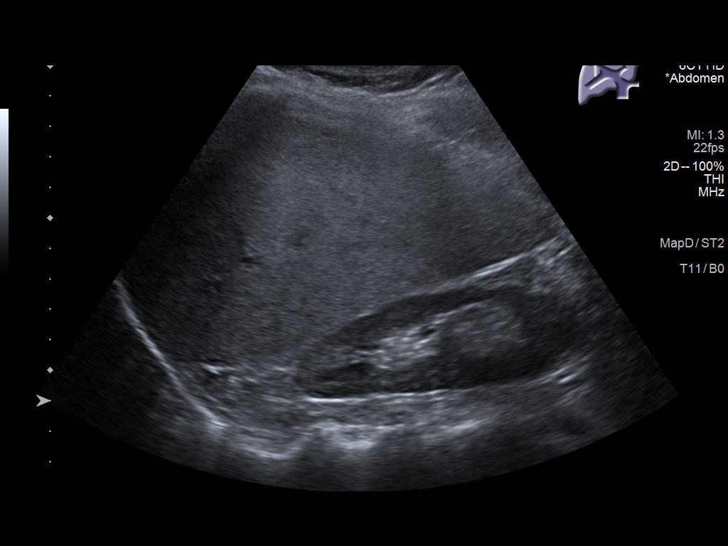

[14 of 25 positions shown; findings below may reference images not displayed]

FINDINGS: Gallbladder:

No gallstones or wall thickening visualized. No sonographic Murphy
sign noted by sonographer.

Common bile duct:

Diameter: 4 mm in proximal diameter

Liver:

No focal lesion identified. Within normal limits in parenchymal
echogenicity. Portal vein is patent on color Doppler imaging with
normal direction of blood flow towards the liver.

Other: None.
IMPRESSION: Normal right upper quadrant sonogram

## 2023-02-17 ENCOUNTER — Encounter: Payer: BC Managed Care – PPO | Admitting: Nurse Practitioner

## 2023-02-27 ENCOUNTER — Other Ambulatory Visit: Payer: Self-pay | Admitting: Physician Assistant

## 2023-02-27 DIAGNOSIS — F321 Major depressive disorder, single episode, moderate: Secondary | ICD-10-CM

## 2023-02-27 NOTE — Telephone Encounter (Signed)
Medication discontinued by PCP 08/15/22. Requested Prescriptions  Pending Prescriptions Disp Refills   buPROPion (WELLBUTRIN XL) 150 MG 24 hr tablet [Pharmacy Med Name: BUPROPION XL 150MG  TABLETS (24 H)] 30 tablet 1    Sig: TAKE 1 TABLET(150 MG) BY MOUTH DAILY     Psychiatry: Antidepressants - bupropion Failed - 02/27/2023  8:17 AM      Failed - Valid encounter within last 6 months    Recent Outpatient Visits           6 months ago Primary hypertension   [place] [facility] Larae Grooms, NP   8 months ago Primary hypertension   [place] [facility] Mecum, Oswaldo Conroy, PA-C   11 months ago Acute otitis media, unspecified otitis media type   [facility] Larae Grooms, NP   1 year ago Primary hypertension   [place] [facility] Larae Grooms, NP   1 year ago Primary hypertension   [place] [facility] Larae Grooms, NP              Passed - Cr in normal range and within 360 days    Creatinine, Ser  Date Value Ref Range Status  08/15/2022 0.92 0.57 - 1.00 mg/dL Final         Passed - AST in normal range and within 360 days    AST  Date Value Ref Range Status  08/15/2022 30 0 - 40 IU/L Final         Passed - ALT in normal range and within 360 days    ALT  Date Value Ref Range Status  08/15/2022 26 0 - 32 IU/L Final   ALT (SGPT) P5P  Date Value Ref Range Status  07/04/2021 225 (H) 0 - 40 IU/L Final         Passed - Completed PHQ-2 or PHQ-9 in the last 360 days      Passed - Last BP in normal range    BP Readings from Last 1 Encounters:  08/15/22 138/77

## 2023-06-10 ENCOUNTER — Other Ambulatory Visit: Payer: Self-pay | Admitting: Nurse Practitioner

## 2023-06-10 DIAGNOSIS — I10 Essential (primary) hypertension: Secondary | ICD-10-CM

## 2023-06-11 NOTE — Telephone Encounter (Signed)
 Requested medication (s) are due for refill today: yes  Requested medication (s) are on the active medication list: yes  Last refill:  08/15/22 #90/1  Future visit scheduled: yes  Notes to clinic:  pt due for OV and updated labs, please review for refill      Requested Prescriptions  Pending Prescriptions Disp Refills   lisinopril (ZESTRIL) 30 MG tablet [Pharmacy Med Name: LISINOPRIL 30MG  TABLETS] 90 tablet 1    Sig: TAKE 1 TABLET(30 MG) BY MOUTH DAILY     Cardiovascular:  ACE Inhibitors Failed - 06/11/2023  2:02 PM      Failed - Cr in normal range and within 180 days    Creatinine, Ser  Date Value Ref Range Status  08/15/2022 0.92 0.57 - 1.00 mg/dL Final         Failed - K in normal range and within 180 days    Potassium  Date Value Ref Range Status  08/15/2022 4.4 3.5 - 5.2 mmol/L Final         Failed - Valid encounter within last 6 months    Recent Outpatient Visits           10 months ago Primary hypertension   [place] [facility] Larae Grooms, NP   12 months ago Primary hypertension   [place] [facility] Mecum, Oswaldo Conroy, PA-C   1 year ago Acute otitis media, unspecified otitis media type   [place] [facility] Larae Grooms, NP   1 year ago Primary hypertension   [place] [facility] Larae Grooms, NP   1 year ago Primary hypertension   [place] [facility] Larae Grooms, NP       Future Appointments             In 5 days Larae Grooms, NP [facility] [facility]            Passed - Patient is not pregnant      Passed - Last BP in normal range    BP Readings from Last 1 Encounters:  08/15/22 138/77          escitalopram (LEXAPRO) 20 MG tablet [Pharmacy Med Name: ESCITALOPRAM 20MG  TABLETS] 90 tablet 1    Sig: TAKE 1 TABLET(20 MG) BY MOUTH DAILY     Psychiatry:  Antidepressants - SSRI Failed - 06/11/2023  2:02 PM      Failed -  Valid encounter within last 6 months    Recent Outpatient Visits           10 months ago Primary hypertension   [facility] Larae G[facility], NP   12 months ago Primary hypertension   [place] [facility] Mecum, Erin E, PA-C   1 year ago Acute otitis media, unspecified otitis media type   [place] [facility] Larae Grooms, NP   1 year ago Primary hypertension   [place] [facility] Larae Grooms, NP   1 year ago Primary hypertension   [place] [facility] Larae Grooms, NP       Future Appointments             In 5 days Larae Grooms, NP [place] [facility]            Passed - Completed PHQ-2 or PHQ-9 in the last 360 days

## 2023-06-13 ENCOUNTER — Other Ambulatory Visit: Payer: Self-pay

## 2023-06-13 ENCOUNTER — Other Ambulatory Visit: Payer: Self-pay | Admitting: Family Medicine

## 2023-06-13 ENCOUNTER — Other Ambulatory Visit: Payer: Self-pay | Admitting: Nurse Practitioner

## 2023-06-13 DIAGNOSIS — I10 Essential (primary) hypertension: Secondary | ICD-10-CM

## 2023-06-13 MED ORDER — LISINOPRIL 30 MG PO TABS: ORAL_TABLET | ORAL | 0 refills | Status: DC

## 2023-06-13 MED ORDER — ESCITALOPRAM OXALATE 20 MG PO TABS: ORAL_TABLET | ORAL | 0 refills | Status: DC

## 2023-06-13 NOTE — Telephone Encounter (Signed)
 Routing to providers in office as PCP is out. Patient has appointment scheduled with Clydie Braun on Monday. Prescriptions t'd up for 30 day courtesy fill if approved.     Copied from CRM [id number]. Topic: Clinical - Prescription Issue >> Jun 13, 2023 12:18 PM Antwanette L wrote: Reason for CRM: Patient is calling in about a prescription refill on lisinopril (ZESTRIL) 30 MG tablet and escitalopram (LEXAPRO) 20 MG tablet. Patient has been out of lisinopril for 2 days and only have one escitalopram pill.Listed below is the patient pharmacy.  [facility] [address] [place], [place] [address] Phone: [phone]  Fax: [phone]

## 2023-06-13 NOTE — Telephone Encounter (Signed)
 Copied from CRM [id number]. Topic: Clinical - Medication Refill >> Jun 13, 2023 12:14 PM Amanda Krause wrote: Most Recent Primary Care Visit:  Provider: Larae Grooms  Department: [id number]  Visit Type: OFFICE VISIT  Date: 08/15/2022  Medication: lisinopril (ZESTRIL) 30 MG tablet and  escitalopram (LEXAPRO) 20 MG tablet  Has the patient contacted their pharmacy? Yes. Pharmacy sent a fax to refill the medicine, but the order got denied.   Is this the correct pharmacy for this prescription? Yes   This is the patient's preferred pharmacy:  [id number] [place], [place] - [address] AT [facility] [address] [place] [address] Phone: [phone] Fax: [phone]  [facility] [place], [place] - [address], [place]  [address] Phone: [phone] Fax: [phone]   Has the prescription been filled recently? No  Is the patient out of the medication? Yes. Patient has been out of Lisinopril for two days and she only has one escitalopram pill left.  Has the patient been seen for an appointment in the last year OR does the patient have an upcoming appointment? Yes  Can we respond through MyChart? Yes and by phone.  Agent: Please be advised that Rx refills may take up to 3 business days. We ask that you follow-up with your pharmacy.

## 2023-06-13 NOTE — Telephone Encounter (Signed)
 Called and LVM letting patient know that we have sent her medications in for her.

## 2023-06-16 ENCOUNTER — Ambulatory Visit: Payer: 59 | Admitting: Nurse Practitioner

## 2023-06-16 ENCOUNTER — Encounter: Payer: Self-pay | Admitting: Nurse Practitioner

## 2023-06-16 VITALS — BP 146/82 | HR 89 | Ht <= 58 in | Wt 118.0 lb

## 2023-06-16 DIAGNOSIS — Z23 Encounter for immunization: Secondary | ICD-10-CM

## 2023-06-16 DIAGNOSIS — Z0000 Encounter for general adult medical examination without abnormal findings: Secondary | ICD-10-CM

## 2023-06-16 DIAGNOSIS — R030 Elevated blood-pressure reading, without diagnosis of hypertension: Secondary | ICD-10-CM

## 2023-06-16 DIAGNOSIS — F321 Major depressive disorder, single episode, moderate: Secondary | ICD-10-CM

## 2023-06-16 DIAGNOSIS — R7309 Other abnormal glucose: Secondary | ICD-10-CM

## 2023-06-16 DIAGNOSIS — I10 Essential (primary) hypertension: Secondary | ICD-10-CM

## 2023-06-16 DIAGNOSIS — Z136 Encounter for screening for cardiovascular disorders: Secondary | ICD-10-CM

## 2023-06-16 LAB — URINALYSIS, ROUTINE W REFLEX MICROSCOPIC
Bilirubin, UA: NEGATIVE
Glucose, UA: NEGATIVE
Leukocytes,UA: NEGATIVE
Nitrite, UA: NEGATIVE
Protein,UA: NEGATIVE
RBC, UA: NEGATIVE
Specific Gravity, UA: 1.01 (ref 1.005–1.030)
Urobilinogen, Ur: 0.2 mg/dL (ref 0.2–1.0)
pH, UA: 5.5 (ref 5.0–7.5)

## 2023-06-16 MED ORDER — CITALOPRAM HYDROBROMIDE 20 MG PO TABS: 20.0000 mg | ORAL_TABLET | Freq: Every day | ORAL | 0 refills | Status: DC

## 2023-06-16 MED ORDER — OLMESARTAN MEDOXOMIL 20 MG PO TABS
20.0000 mg | ORAL_TABLET | Freq: Every day | ORAL | 0 refills | Status: DC
Start: 2023-06-16 — End: 2023-09-16

## 2023-06-16 NOTE — Progress Notes (Signed)
 BP (!) 146/82 (BP Location: Right Arm, Patient Position: Sitting, Cuff Size: Normal)   Pulse 89   Ht 4\' 10"  (1.473 m)   Wt 118 lb (53.5 kg)   SpO2 97%   BMI 24.66 kg/m    Subjective:    Patient ID: Amanda Krause, female    DOB: 14-Feb-1961, 63 y.o.   MRN: [id number]  HPI: Amanda Krause is a 63 y.o. female presenting on 06/16/2023 for comprehensive medical examination. Current medical complaints include:mood  She currently lives with: Menopausal Symptoms: no  HYPERTENSION Hypertension status: uncontrolled  Satisfied with current treatment? no Duration of hypertension: years BP monitoring frequency:  daily BP range: 150/90 BP medication side effects:  no Medication compliance: excellent compliance Previous BP meds:none Aspirin: no Recurrent headaches: no Visual changes: no Palpitations: no Dyspnea: no Chest pain: no Lower extremity edema: no Dizzy/lightheaded: no   DEPRESSION Patient states the Lexapro needs to be stronger.  She feels like her mother is driving her crazy.   She did get a new puppy which is bringing her joy.   Depression Screen done today and results listed below:     06/16/2023    1:27 PM 08/15/2022    3:25 PM 06/13/2022    2:37 PM 03/05/2022    4:18 PM 02/01/2022    3:43 PM  Depression screen PHQ 2/9  Decreased Interest 0 1 2 1 1   Down, Depressed, Hopeless 3 1 3 1 1   PHQ - 2 Score 3 2 5 2 2   Altered sleeping 1 2 1 2 2   Tired, decreased energy 0 2 2 1 3   Change in appetite 1 2 1 1  0  Feeling bad or failure about yourself  1 1 1 2 1   Trouble concentrating 0 2 1 1 1   Moving slowly or fidgety/restless 0 1 0 1 1  Suicidal thoughts 1 1 2 1 1   PHQ-9 Score 7 13 13 11 11   Difficult doing work/chores Somewhat difficult Somewhat difficult Somewhat difficult Somewhat difficult Somewhat difficult    The patient does not have a history of falls. I did complete a risk assessment for falls. A plan of care for falls was documented.   Past Medical History:   Past Medical History:  Diagnosis Date   Arthritis    Depression    Herpes     Surgical History:  Past Surgical History:  Procedure Laterality Date   COLONOSCOPY N/A 03/20/2021   Procedure: COLONOSCOPY;  Surgeon: Midge Minium, MD;  Location: [facility] END[facility]COPY;  Service: Endoscopy;  Laterality: N/A;   DIAGNOSTIC LAPAROSCOPY     OVARIAN CYST SURGERY      Medications:  Current Outpatient Medications on File Prior to Visit  Medication Sig   busPIRone (BUSPAR) 10 MG tablet Take 1 tablet (10 mg total) by mouth 2 (two) times daily.   cholecalciferol (VITAMIN D3) 25 MCG (1000 UNIT) tablet Take 1,000 Units by mouth daily.   ibuprofen (ADVIL) 800 MG tablet Take 1 tablet (800 mg total) by mouth every 8 (eight) hours as needed.   nicotine (NICODERM CQ) 21 mg/24hr patch Place 1 patch (21 mg total) onto the skin daily.   valACYclovir (VALTREX) 1000 MG tablet Take 1 tablet (1,000 mg total) by mouth 2 (two) times daily.   No current facility-administered medications on file prior to visit.    Allergies:  No Known Allergies  Social History:  Social History   Socioeconomic History   Marital status: Single    Spouse name: Not  on file   Number of children: Not on file   Years of education: Not on file   Highest education level: Not on file  Occupational History   Not on file  Tobacco Use   Smoking status: Every Day    Current packs/day: 0.25    Types: Cigarettes   Smokeless tobacco: Never  Vaping Use   Vaping status: Never Used  Substance and Sexual Activity   Alcohol use: Yes    Comment: on occasion   Drug use: Never   Sexual activity: Not Currently  Other Topics Concern   Not on file  Social History Narrative   Not on file   Social Drivers of Health   Financial Resource Strain: Not on file  Food Insecurity: Not on file  Transportation Needs: Not on file  Physical Activity: Not on file  Stress: Not on file  Social Connections: Not on file  Intimate Partner Violence: Not  on file   Social History   Tobacco Use  Smoking Status Every Day   Current packs/day: 0.25   Types: Cigarettes  Smokeless Tobacco Never   Social History   Substance and Sexual Activity  Alcohol Use Yes   Comment: on occasion    Family History:  Family History  Problem Relation Age of Onset   Colon cancer Mother    Heart disease Father    Breast cancer Neg Hx     Past medical history, surgical history, medications, allergies, family history and social history reviewed with patient today and changes made to appropriate areas of the chart.   Review of Systems  Eyes:  Negative for blurred vision and double vision.  Respiratory:  Negative for shortness of breath.   Cardiovascular:  Negative for chest pain, palpitations and leg swelling.  Neurological:  Negative for dizziness and headaches.  Psychiatric/Behavioral:  Positive for depression. The patient is nervous/anxious.    All other ROS negative except what is listed above and in the HPI.      Objective:    BP (!) 146/82 (BP Location: Right Arm, Patient Position: Sitting, Cuff Size: Normal)   Pulse 89   Ht 4\' 10"  (1.473 m)   Wt 118 lb (53.5 kg)   SpO2 97%   BMI 24.66 kg/m   Wt Readings from Last 3 Encounters:  06/16/23 118 lb (53.5 kg)  08/15/22 118 lb 6.4 oz (53.7 kg)  06/13/22 119 lb 6.4 oz (54.2 kg)    Physical Exam Vitals and nursing note reviewed. Exam conducted with a chaperone present [facility] [place]).  Constitutional:      General: She is awake. She is not in acute distress.    Appearance: She is well-developed. She is not ill-appearing.  HENT:     Head: Normocephalic and atraumatic.     Right Ear: Hearing, tympanic membrane, ear canal and external ear normal. No drainage.     Left Ear: Hearing, tympanic membrane, ear canal and external ear normal. No drainage.     Nose: Nose normal.     Right Sinus: No maxillary sinus tenderness or frontal sinus tenderness.     Left Sinus: No maxillary sinus  tenderness or frontal sinus tenderness.     Mouth/Throat:     Mouth: Mucous membranes are moist.     Pharynx: Oropharynx is clear. Uvula midline. No pharyngeal swelling, oropharyngeal exudate or posterior oropharyngeal erythema.  Eyes:     General: Lids are normal.        Right eye: No discharge.  Left eye: No discharge.     Extraocular Movements: Extraocular movements intact.     Conjunctiva/sclera: Conjunctivae normal.     Pupils: Pupils are equal, round, and reactive to light.     Visual Fields: Right eye visual fields normal and left eye visual fields normal.  Neck:     Thyroid: No thyromegaly.     Vascular: No carotid bruit.     Trachea: Trachea normal.  Cardiovascular:     Rate and Rhythm: Normal rate and regular rhythm.     Heart sounds: Normal heart sounds. No murmur heard.    No gallop.  Pulmonary:     Effort: Pulmonary effort is normal. No accessory muscle usage or respiratory distress.     Breath sounds: Normal breath sounds.  Chest:  Breasts:    Right: Normal.     Left: Normal.  Abdominal:     General: Bowel sounds are normal.     Palpations: Abdomen is soft. There is no hepatomegaly or splenomegaly.     Tenderness: There is no abdominal tenderness.  Genitourinary:    Vagina: Normal.     Cervix: Normal.     Adnexa: Right adnexa normal and left adnexa normal.  Musculoskeletal:        General: Normal range of motion.     Cervical back: Normal range of motion and neck supple.     Right lower leg: No edema.     Left lower leg: No edema.  Lymphadenopathy:     Head:     Right side of head: No submental, submandibular, tonsillar, preauricular or posterior auricular adenopathy.     Left side of head: No submental, submandibular, tonsillar, preauricular or posterior auricular adenopathy.     Cervical: No cervical adenopathy.     Upper Body:     Right upper body: No supraclavicular, axillary or pectoral adenopathy.     Left upper body: No supraclavicular,  axillary or pectoral adenopathy.  Skin:    General: Skin is warm and dry.     Capillary Refill: Capillary refill takes less than 2 seconds.     Findings: No rash.  Neurological:     Mental Status: She is alert and oriented to person, place, and time.     Gait: Gait is intact.     Deep Tendon Reflexes: Reflexes are normal and symmetric.     Reflex Scores:      Brachioradialis reflexes are 2+ on the right side and 2+ on the left side.      Patellar reflexes are 2+ on the right side and 2+ on the left side. Psychiatric:        Attention and Perception: Attention normal.        Mood and Affect: Mood normal.        Speech: Speech normal.        Behavior: Behavior normal. Behavior is cooperative.        Thought Content: Thought content normal.        Judgment: Judgment normal.     Results for orders placed or performed in visit on 08/15/22  Comp Met (CMET)   Collection Time: 08/15/22  3:44 PM  Result Value Ref Range   Glucose 85 70 - 99 mg/dL   BUN 29 (H) 8 - 27 mg/dL   Creatinine, Ser 2.13 0.57 - 1.00 mg/dL   eGFR 70 >08 MV/HQI/6.96   BUN/Creatinine Ratio 32 (H) 12 - 28   Sodium 142 134 - 144 mmol/L   Potassium  4.4 3.5 - 5.2 mmol/L   Chloride 104 96 - 106 mmol/L   CO2 23 20 - 29 mmol/L   Calcium 10.6 (H) 8.7 - 10.3 mg/dL   Total Protein 7.2 6.0 - 8.5 g/dL   Albumin 4.4 3.9 - 4.9 g/dL   Globulin, Total 2.8 1.5 - 4.5 g/dL   Albumin/Globulin Ratio 1.6 1.2 - 2.2   Bilirubin Total 0.4 0.0 - 1.2 mg/dL   Alkaline Phosphatase 69 44 - 121 IU/L   AST 30 0 - 40 IU/L   ALT 26 0 - 32 IU/L  HgB A1c   Collection Time: 08/15/22  3:44 PM  Result Value Ref Range   Hgb A1c MFr Bld 5.5 4.8 - 5.6 %   Est. average glucose Bld gHb Est-mCnc 111 mg/dL      Assessment & Plan:   Problem List Items Addressed This Visit       Cardiovascular and Mediastinum   Primary hypertension   Chronic. Not well controlled.  Will change from Lisinopril to Olmesartan 20mg .  Side effects and benefits  discussed.  Labs ordered today.  Follow up in 1 month.  Call sooner if concerns arise.       Relevant Medications   olmesartan (BENICAR) 20 MG tablet     Other   Depression, major, single episode, moderate (HCC)   Chronic.  Not well controlled.  Will change from Lexapro to Celexa 20mg .  Side effects and benefits of medication discussed during visit today.  Labs ordered today.  Return to clinic in 1 month for reevaluation.  Call sooner if concerns arise.        Relevant Medications   citalopram (CELEXA) 20 MG tablet   RESOLVED: Elevated blood pressure reading in office without diagnosis of hypertension   Other Visit Diagnoses       Annual physical exam    -  Primary   Health maintenance reviewed during visit today.  Labs ordered.  Vaccines reviewed.  PAP, mammogram and colonoscopy up to date.   Relevant Orders   CBC with Differential/Platelet   Comprehensive metabolic panel   Lipid panel   TSH   Urinalysis, Routine w reflex microscopic     Elevated glucose       Labs ordered at visit today. Will make recommendations based on labs.   Relevant Orders   Hemoglobin A1c     Screening for ischemic heart disease       Relevant Orders   Lipid panel     Need for vaccination against Streptococcus pneumoniae       Relevant Orders   Pneumococcal conjugate vaccine 20-valent (Prevnar 20)         Follow up plan: Return in about 1 month (around 07/14/2023) for Depression/Anxiety FU, BP Check.   LABORATORY TESTING:  - Pap smear: pap done  IMMUNIZATIONS:   - Tdap: Tetanus vaccination status reviewed: last tetanus booster within 10 years. - Influenza: Administered today - Pneumovax:  will get at next visit - Prevnar: Not applicable - COVID: Up to date - HPV: Not applicable - Shingrix vaccine: Up to date  SCREENING: -Mammogram: Up to date  - Colonoscopy: Up to date  - Bone Density: Not applicable  -Hearing Test: Not applicable  -Spirometry: Not applicable   PATIENT  COUNSELING:   Advised to take 1 mg of folate supplement per day if capable of pregnancy.   Sexuality: Discussed sexually transmitted diseases, partner selection, use of condoms, avoidance of unintended pregnancy  and contraceptive alternatives.  Advised to avoid cigarette smoking.  I discussed with the patient that most people either abstain from alcohol or drink within safe limits (<=14/week and <=4 drinks/occasion for males, <=7/weeks and <= 3 drinks/occasion for females) and that the risk for alcohol disorders and other health effects rises proportionally with the number of drinks per week and how often a drinker exceeds daily limits.  Discussed cessation/primary prevention of drug use and availability of treatment for abuse.   Diet: Encouraged to adjust caloric intake to maintain  or achieve ideal body weight, to reduce intake of dietary saturated fat and total fat, to limit sodium intake by avoiding high sodium foods and not adding table salt, and to maintain adequate dietary potassium and calcium preferably from fresh fruits, vegetables, and low-fat dairy products.    stressed the importance of regular exercise  Injury prevention: Discussed safety belts, safety helmets, smoke detector, smoking near bedding or upholstery.   Dental health: Discussed importance of regular tooth brushing, flossing, and dental visits.    NEXT PREVENTATIVE PHYSICAL DUE IN 1 YEAR. Return in about 1 month (around 07/14/2023) for Depression/Anxiety FU, BP Check.

## 2023-06-16 NOTE — Telephone Encounter (Signed)
 Refilled 06/13/23 # 30. Requested Prescriptions  Refused Prescriptions Disp Refills   escitalopram (LEXAPRO) 20 MG tablet 90 tablet 1    Sig: TAKE 1 TABLET(20 MG) BY MOUTH DAILY     There is no refill protocol information for this order     lisinopril (ZESTRIL) 30 MG tablet 90 tablet 1    Sig: TAKE 1 TABLET(30 MG) BY MOUTH DAILY     There is no refill protocol information for this order

## 2023-06-16 NOTE — Assessment & Plan Note (Signed)
 Chronic. Not well controlled.  Will change from Lisinopril to Olmesartan 20mg .  Side effects and benefits discussed.  Labs ordered today.  Follow up in 1 month.  Call sooner if concerns arise.

## 2023-06-16 NOTE — Telephone Encounter (Signed)
 Refilled 06/13/23 # 30. Requested Prescriptions  Refused Prescriptions Disp Refills   lisinopril (ZESTRIL) 30 MG tablet [Pharmacy Med Name: LISINOPRIL 30MG  TABLETS] 90 tablet     Sig: TAKE 1 TABLET(30 MG) BY MOUTH DAILY     Cardiovascular:  ACE Inhibitors Failed - 06/16/2023 12:28 PM      Failed - Cr in normal range and within 180 days    Creatinine, Ser  Date Value Ref Range Status  08/15/2022 0.92 0.57 - 1.00 mg/dL Final         Failed - K in normal range and within 180 days    Potassium  Date Value Ref Range Status  08/15/2022 4.4 3.5 - 5.2 mmol/L Final         Failed - Valid encounter within last 6 months    Recent Outpatient Visits           10 months ago Primary hypertension   [place] [facility] Larae Grooms, NP   1 year ago Primary hypertension   [place] [facility] Mecum, Oswaldo Conroy, PA-C   1 year ago Acute otitis media, unspecified otitis media type   [place] [facility] Larae Grooms, NP   1 year ago Primary hypertension   [facility] Lar[facility] G[facility], [facility]   1 year ago Primary hypertension   [place] [facility] Larae Grooms, NP       Future Appointments             Today Larae Grooms, NP  [facility]            Passed - Patient is not pregnant      Passed - Last BP in normal range    BP Readings from Last 1 Encounters:  08/15/22 138/77

## 2023-06-16 NOTE — Assessment & Plan Note (Signed)
 Chronic.  Not well controlled.  Will change from Lexapro to Celexa 20mg .  Side effects and benefits of medication discussed during visit today.  Labs ordered today.  Return to clinic in 1 month for reevaluation.  Call sooner if concerns arise.

## 2023-06-17 ENCOUNTER — Encounter: Payer: Self-pay | Admitting: Nurse Practitioner

## 2023-06-17 LAB — CBC WITH DIFFERENTIAL/PLATELET
Basophils Absolute: 0.1 10*3/uL (ref 0.0–0.2)
Basos: 1 %
EOS (ABSOLUTE): 0.1 10*3/uL (ref 0.0–0.4)
Eos: 1 %
Hematocrit: 41.2 % (ref 34.0–46.6)
Hemoglobin: 13.7 g/dL (ref 11.1–15.9)
Immature Grans (Abs): 0 10*3/uL (ref 0.0–0.1)
Immature Granulocytes: 0 %
Lymphocytes Absolute: 2.9 10*3/uL (ref 0.7–3.1)
Lymphs: 49 %
MCH: 31.6 pg (ref 26.6–33.0)
MCHC: 33.3 g/dL (ref 31.5–35.7)
MCV: 95 fL (ref 79–97)
Monocytes Absolute: 0.4 10*3/uL (ref 0.1–0.9)
Monocytes: 7 %
Neutrophils Absolute: 2.5 10*3/uL (ref 1.4–7.0)
Neutrophils: 42 %
Platelets: 176 10*3/uL (ref 150–450)
RBC: 4.34 x10E6/uL (ref 3.77–5.28)
RDW: 12.2 % (ref 11.7–15.4)
WBC: 6 10*3/uL (ref 3.4–10.8)

## 2023-06-17 LAB — LIPID PANEL
Chol/HDL Ratio: 1.7 ratio (ref 0.0–4.4)
Cholesterol, Total: 186 mg/dL (ref 100–199)
HDL: 108 mg/dL (ref >39)
LDL Chol Calc (NIH): 67 mg/dL (ref 0–99)
Triglycerides: 60 mg/dL (ref 0–149)
VLDL Cholesterol Cal: 11 mg/dL (ref 5–40)

## 2023-06-17 LAB — COMPREHENSIVE METABOLIC PANEL
ALT: 22 IU/L (ref 0–32)
AST: 33 IU/L (ref 0–40)
Albumin: 4.6 g/dL (ref 3.9–4.9)
Alkaline Phosphatase: 69 IU/L (ref 44–121)
BUN/Creatinine Ratio: 19 (ref 12–28)
BUN: 16 mg/dL (ref 8–27)
Bilirubin Total: 0.2 mg/dL (ref 0.0–1.2)
CO2: 21 mmol/L (ref 20–29)
Calcium: 10.2 mg/dL (ref 8.7–10.3)
Chloride: 102 mmol/L (ref 96–106)
Creatinine, Ser: 0.86 mg/dL (ref 0.57–1.00)
Globulin, Total: 2.5 g/dL (ref 1.5–4.5)
Glucose: 82 mg/dL (ref 70–99)
Potassium: 4.4 mmol/L (ref 3.5–5.2)
Sodium: 139 mmol/L (ref 134–144)
Total Protein: 7.1 g/dL (ref 6.0–8.5)
eGFR: 75 mL/min/{1.73_m2} (ref >59)

## 2023-06-17 LAB — TSH: TSH: 1.68 u[IU]/mL (ref 0.450–4.500)

## 2023-06-17 LAB — HEMOGLOBIN A1C
Est. average glucose Bld gHb Est-mCnc: 103 mg/dL
Hgb A1c MFr Bld: 5.2 % (ref 4.8–5.6)

## 2023-07-17 ENCOUNTER — Ambulatory Visit: Payer: 59 | Admitting: Nurse Practitioner

## 2023-07-17 NOTE — Progress Notes (Deleted)
 There were no vitals taken for this visit.   Subjective:    Patient ID: Amanda Krause, female    DOB: 1960-11-10, 63 y.o.   MRN: [id number]  HPI: Amanda Krause is a 63 y.o. female  No chief complaint on file.  HYPERTENSION Hypertension status: uncontrolled  Satisfied with current treatment? no Duration of hypertension: years BP monitoring frequency:  daily BP range: 150/90 BP medication side effects:  no Medication compliance: excellent compliance Previous BP meds:none Aspirin: no Recurrent headaches: no Visual changes: no Palpitations: no Dyspnea: no Chest pain: no Lower extremity edema: no Dizzy/lightheaded: no   DEPRESSION Patient states the Lexapro needs to be stronger.  She feels like her mother is driving her crazy.   She did get a new puppy which is bringing her joy.     Relevant past medical, surgical, family and social history reviewed and updated as indicated. Interim medical history since our last visit reviewed. Allergies and medications reviewed and updated.  Review of Systems  Per HPI unless specifically indicated above     Objective:    There were no vitals taken for this visit.  Wt Readings from Last 3 Encounters:  06/16/23 118 lb (53.5 kg)  08/15/22 118 lb 6.4 oz (53.7 kg)  06/13/22 119 lb 6.4 oz (54.2 kg)    Physical Exam  Results for orders placed or performed in visit on 06/16/23  Urinalysis, Routine w reflex microscopic   Collection Time: 06/16/23  1:51 PM  Result Value Ref Range   Specific Gravity, UA 1.010 1.005 - 1.030   pH, UA 5.5 5.0 - 7.5   Color, UA Yellow Yellow   Appearance Ur Clear Clear   Leukocytes,UA Negative Negative   Protein,UA Negative Negative/Trace   Glucose, UA Negative Negative   Ketones, UA Trace (A) Negative   RBC, UA Negative Negative   Bilirubin, UA Negative Negative   Urobilinogen, Ur 0.2 0.2 - 1.0 mg/dL   Nitrite, UA Negative Negative  CBC with Differential/Platelet   Collection Time: 06/16/23  1:52 PM   Result Value Ref Range   WBC 6.0 3.4 - 10.8 x10E3/uL   RBC 4.34 3.77 - 5.28 x10E6/uL   Hemoglobin 13.7 11.1 - 15.9 g/dL   Hematocrit 95.6 21.3 - 46.6 %   MCV 95 79 - 97 fL   MCH 31.6 26.6 - 33.0 pg   MCHC 33.3 31.5 - 35.7 g/dL   RDW 08.6 57.8 - 46.9 %   Platelets 176 150 - 450 x10E3/uL   Neutrophils 42 Not Estab. %   Lymphs 49 Not Estab. %   Monocytes 7 Not Estab. %   Eos 1 Not Estab. %   Basos 1 Not Estab. %   Neutrophils Absolute 2.5 1.4 - 7.0 x10E3/uL   Lymphocytes Absolute 2.9 0.7 - 3.1 x10E3/uL   Monocytes Absolute 0.4 0.1 - 0.9 x10E3/uL   EOS (ABSOLUTE) 0.1 0.0 - 0.4 x10E3/uL   Basophils Absolute 0.1 0.0 - 0.2 x10E3/uL   Immature Granulocytes 0 Not Estab. %   Immature Grans (Abs) 0.0 0.0 - 0.1 x10E3/uL  Comprehensive metabolic panel   Collection Time: 06/16/23  1:52 PM  Result Value Ref Range   Glucose 82 70 - 99 mg/dL   BUN 16 8 - 27 mg/dL   Creatinine, Ser 6.29 0.57 - 1.00 mg/dL   eGFR 75 >52 WU/XLK/4.40   BUN/Creatinine Ratio 19 12 - 28   Sodium 139 134 - 144 mmol/L   Potassium 4.4 3.5 - 5.2 mmol/L  Chloride 102 96 - 106 mmol/L   CO2 21 20 - 29 mmol/L   Calcium 10.2 8.7 - 10.3 mg/dL   Total Protein 7.1 6.0 - 8.5 g/dL   Albumin 4.6 3.9 - 4.9 g/dL   Globulin, Total 2.5 1.5 - 4.5 g/dL   Bilirubin Total 0.2 0.0 - 1.2 mg/dL   Alkaline Phosphatase 69 44 - 121 IU/L   AST 33 0 - 40 IU/L   ALT 22 0 - 32 IU/L  Lipid panel   Collection Time: 06/16/23  1:52 PM  Result Value Ref Range   Cholesterol, Total 186 100 - 199 mg/dL   Triglycerides 60 0 - 149 mg/dL   HDL 161 >09 mg/dL   VLDL Cholesterol Cal 11 5 - 40 mg/dL   LDL Chol Calc (NIH) 67 0 - 99 mg/dL   Chol/HDL Ratio 1.7 0.0 - 4.4 ratio  TSH   Collection Time: 06/16/23  1:52 PM  Result Value Ref Range   TSH 1.680 0.450 - 4.500 uIU/mL  Hemoglobin A1c   Collection Time: 06/16/23  1:52 PM  Result Value Ref Range   Hgb A1c MFr Bld 5.2 4.8 - 5.6 %   Est. average glucose Bld gHb Est-mCnc 103 mg/dL       Assessment & Plan:   Problem List Items Addressed This Visit   None    Follow up plan: No follow-ups on file.

## 2023-07-19 ENCOUNTER — Other Ambulatory Visit: Payer: Self-pay | Admitting: Nurse Practitioner

## 2023-07-22 NOTE — Telephone Encounter (Signed)
 Requested medication (s) are due for refill today: yes  Requested medication (s) are on the active medication list: yes  Last refill:  06/16/23 #30 0 refills  Future visit scheduled: no   Notes to clinic:  no refills remain. Do you want to refill Rx?     Requested Prescriptions  Pending Prescriptions Disp Refills   citalopram (CELEXA) 20 MG tablet [Pharmacy Med Name: CITALOPRAM 20MG  TABLETS] 30 tablet 0    Sig: TAKE 1 TABLET(20 MG) BY MOUTH DAILY     Psychiatry:  Antidepressants - SSRI Passed - 07/22/2023 11:48 AM      Passed - Completed PHQ-2 or PHQ-9 in the last 360 days      Passed - Valid encounter within last 6 months    Recent Outpatient Visits           1 month ago Annual physical exam   [place] [facility] Larae Grooms, NP

## 2023-07-23 ENCOUNTER — Ambulatory Visit: Admitting: Nurse Practitioner

## 2023-07-23 ENCOUNTER — Encounter: Payer: Self-pay | Admitting: Nurse Practitioner

## 2023-07-23 VITALS — BP 138/75 | HR 82 | Temp 98.4°F | Resp 15 | Ht <= 58 in | Wt 115.4 lb

## 2023-07-23 DIAGNOSIS — I10 Essential (primary) hypertension: Secondary | ICD-10-CM

## 2023-07-23 DIAGNOSIS — Z720 Tobacco use: Secondary | ICD-10-CM

## 2023-07-23 DIAGNOSIS — F321 Major depressive disorder, single episode, moderate: Secondary | ICD-10-CM

## 2023-07-23 MED ORDER — NICOTINE 21 MG/24HR TD PT24: 21.0000 mg | MEDICATED_PATCH | Freq: Every day | TRANSDERMAL | 1 refills | Status: DC

## 2023-07-23 MED ORDER — CITALOPRAM HYDROBROMIDE 40 MG PO TABS: 40.0000 mg | ORAL_TABLET | Freq: Every day | ORAL | 1 refills | Status: DC

## 2023-07-23 MED ORDER — NICOTINE 10 MG IN INHA: 1.0000 | RESPIRATORY_TRACT | 1 refills | Status: DC | PRN

## 2023-07-23 NOTE — Assessment & Plan Note (Signed)
 Chronic.  Controlled.  Continue with current medication regimen of Olmesartan daily.  Return to clinic in 3 months for reevaluation.  Call sooner if concerns arise.

## 2023-07-23 NOTE — Assessment & Plan Note (Signed)
 Only smoking 5 cigarettes per day.  Will give Nicorette inhaler to help with smoking cessation.

## 2023-07-23 NOTE — Assessment & Plan Note (Signed)
 Chronic. Improved.  Will increase dose of Celexa to 40mg .  Follow up in 3 months.  Call sooner if concerns arise.

## 2023-07-23 NOTE — Progress Notes (Signed)
 BP 138/75 (BP Location: Left Arm, Patient Position: Sitting, Cuff Size: Normal)   Pulse 82   Temp 98.4 F (36.9 C) (Oral)   Resp 15   Ht 4' 9.99" (1.473 m)   Wt 115 lb 6.4 oz (52.3 kg)   SpO2 98%   BMI 24.13 kg/m    Subjective:    Patient ID: Amanda Krause, female    DOB: Aug 11, 1960, 63 y.o.   MRN: [id number]  HPI: Amanda Krause is a 63 y.o. female  Chief Complaint  Patient presents with   Depression/Anxiety    Overall doing much better however does feel that her irritability towards her elder mom is off the charts. Doesn't feel it so much with everyone else. Med switched seemed to be really helpful.     Blood pressure    Feels this is doing much better   HYPERTENSION Hypertension status: controlled Satisfied with current treatment? no Duration of hypertension: years BP monitoring frequency:  daily BP range: 120-130/80 BP medication side effects:  no Medication compliance: excellent compliance Previous BP meds:none Aspirin: no Recurrent headaches: no Visual changes: no Palpitations: no Dyspnea: no Chest pain: no Lower extremity edema: no Dizzy/lightheaded: no   DEPRESSION Patient states she likes the Celexa.  She feels like her irritability is still bad.  She feels like she would benefit from an increased dose of the medication.   A big source of her stress is her mom.      Relevant past medical, surgical, family and social history reviewed and updated as indicated. Interim medical history since our last visit reviewed. Allergies and medications reviewed and updated.  Review of Systems  Eyes:  Negative for visual disturbance.  Respiratory:  Negative for cough, chest tightness and shortness of breath.   Cardiovascular:  Negative for chest pain, palpitations and leg swelling.  Neurological:  Negative for dizziness and headaches.  Psychiatric/Behavioral:  Positive for decreased concentration. Negative for suicidal ideas. The patient is nervous/anxious.     Per  HPI unless specifically indicated above     Objective:    BP 138/75 (BP Location: Left Arm, Patient Position: Sitting, Cuff Size: Normal)   Pulse 82   Temp 98.4 F (36.9 C) (Oral)   Resp 15   Ht 4' 9.99" (1.473 m)   Wt 115 lb 6.4 oz (52.3 kg)   SpO2 98%   BMI 24.13 kg/m   Wt Readings from Last 3 Encounters:  07/23/23 115 lb 6.4 oz (52.3 kg)  06/16/23 118 lb (53.5 kg)  08/15/22 118 lb 6.4 oz (53.7 kg)    Physical Exam Vitals and nursing note reviewed.  Constitutional:      General: She is not in acute distress.    Appearance: Normal appearance. She is normal weight. She is not ill-appearing, toxic-appearing or diaphoretic.  HENT:     Head: Normocephalic.     Right Ear: External ear normal.     Left Ear: External ear normal.     Nose: Nose normal.     Mouth/Throat:     Mouth: Mucous membranes are moist.     Pharynx: Oropharynx is clear.  Eyes:     General:        Right eye: No discharge.        Left eye: No discharge.     Extraocular Movements: Extraocular movements intact.     Conjunctiva/sclera: Conjunctivae normal.     Pupils: Pupils are equal, round, and reactive to light.  Cardiovascular:  Rate and Rhythm: Normal rate and regular rhythm.     Heart sounds: No murmur heard. Pulmonary:     Effort: Pulmonary effort is normal. No respiratory distress.     Breath sounds: Normal breath sounds. No wheezing or rales.  Musculoskeletal:     Cervical back: Normal range of motion and neck supple.  Skin:    General: Skin is warm and dry.     Capillary Refill: Capillary refill takes less than 2 seconds.  Neurological:     General: No focal deficit present.     Mental Status: She is alert and oriented to person, place, and time. Mental status is at baseline.  Psychiatric:        Mood and Affect: Mood normal.        Behavior: Behavior normal.        Thought Content: Thought content normal.        Judgment: Judgment normal.     Results for orders placed or performed  in visit on 06/16/23  Urinalysis, Routine w reflex microscopic   Collection Time: 06/16/23  1:51 PM  Result Value Ref Range   Specific Gravity, UA 1.010 1.005 - 1.030   pH, UA 5.5 5.0 - 7.5   Color, UA Yellow Yellow   Appearance Ur Clear Clear   Leukocytes,UA Negative Negative   Protein,UA Negative Negative/Trace   Glucose, UA Negative Negative   Ketones, UA Trace (A) Negative   RBC, UA Negative Negative   Bilirubin, UA Negative Negative   Urobilinogen, Ur 0.2 0.2 - 1.0 mg/dL   Nitrite, UA Negative Negative  CBC with Differential/Platelet   Collection Time: 06/16/23  1:52 PM  Result Value Ref Range   WBC 6.0 3.4 - 10.8 x10E3/uL   RBC 4.34 3.77 - 5.28 x10E6/uL   Hemoglobin 13.7 11.1 - 15.9 g/dL   Hematocrit 16.1 09.6 - 46.6 %   MCV 95 79 - 97 fL   MCH 31.6 26.6 - 33.0 pg   MCHC 33.3 31.5 - 35.7 g/dL   RDW 04.5 40.9 - 81.1 %   Platelets 176 150 - 450 x10E3/uL   Neutrophils 42 Not Estab. %   Lymphs 49 Not Estab. %   Monocytes 7 Not Estab. %   Eos 1 Not Estab. %   Basos 1 Not Estab. %   Neutrophils Absolute 2.5 1.4 - 7.0 x10E3/uL   Lymphocytes Absolute 2.9 0.7 - 3.1 x10E3/uL   Monocytes Absolute 0.4 0.1 - 0.9 x10E3/uL   EOS (ABSOLUTE) 0.1 0.0 - 0.4 x10E3/uL   Basophils Absolute 0.1 0.0 - 0.2 x10E3/uL   Immature Granulocytes 0 Not Estab. %   Immature Grans (Abs) 0.0 0.0 - 0.1 x10E3/uL  Comprehensive metabolic panel   Collection Time: 06/16/23  1:52 PM  Result Value Ref Range   Glucose 82 70 - 99 mg/dL   BUN 16 8 - 27 mg/dL   Creatinine, Ser 9.14 0.57 - 1.00 mg/dL   eGFR 75 >78 GN/FAO/1.30   BUN/Creatinine Ratio 19 12 - 28   Sodium 139 134 - 144 mmol/L   Potassium 4.4 3.5 - 5.2 mmol/L   Chloride 102 96 - 106 mmol/L   CO2 21 20 - 29 mmol/L   Calcium 10.2 8.7 - 10.3 mg/dL   Total Protein 7.1 6.0 - 8.5 g/dL   Albumin 4.6 3.9 - 4.9 g/dL   Globulin, Total 2.5 1.5 - 4.5 g/dL   Bilirubin Total 0.2 0.0 - 1.2 mg/dL   Alkaline Phosphatase 69 44 - 121 IU/L  AST 33 0 - 40  IU/L   ALT 22 0 - 32 IU/L  Lipid panel   Collection Time: 06/16/23  1:52 PM  Result Value Ref Range   Cholesterol, Total 186 100 - 199 mg/dL   Triglycerides 60 0 - 149 mg/dL   HDL 161 >09 mg/dL   VLDL Cholesterol Cal 11 5 - 40 mg/dL   LDL Chol Calc (NIH) 67 0 - 99 mg/dL   Chol/HDL Ratio 1.7 0.0 - 4.4 ratio  TSH   Collection Time: 06/16/23  1:52 PM  Result Value Ref Range   TSH 1.680 0.450 - 4.500 uIU/mL  Hemoglobin A1c   Collection Time: 06/16/23  1:52 PM  Result Value Ref Range   Hgb A1c MFr Bld 5.2 4.8 - 5.6 %   Est. average glucose Bld gHb Est-mCnc 103 mg/dL      Assessment & Plan:   Problem List Items Addressed This Visit       Cardiovascular and Mediastinum   Primary hypertension   Chronic.  Controlled.  Continue with current medication regimen of Olmesartan daily.  Return to clinic in 3 months for reevaluation.  Call sooner if concerns arise.          Other   Tobacco use   Only smoking 5 cigarettes per day.  Will give Nicorette inhaler to help with smoking cessation.        Depression, major, single episode, moderate (HCC) - Primary   Chronic. Improved.  Will increase dose of Celexa to 40mg .  Follow up in 3 months.  Call sooner if concerns arise.       Relevant Medications   citalopram (CELEXA) 40 MG tablet     Follow up plan: Return in about 3 months (around 10/22/2023) for HTN, HLD, DM2 FU.

## 2023-07-24 NOTE — Addendum Note (Signed)
 Addended by: Larae Grooms on: 07/24/2023 10:35 AM   Modules accepted: Level of Service

## 2023-09-12 ENCOUNTER — Other Ambulatory Visit: Payer: Self-pay | Admitting: Nurse Practitioner

## 2023-09-16 NOTE — Telephone Encounter (Signed)
 Requested Prescriptions  Pending Prescriptions Disp Refills   olmesartan  (BENICAR ) 20 MG tablet [Pharmacy Med Name: OLMESARTAN  MEDOXOMIL 20MG  TABLETS] 90 tablet 1    Sig: TAKE 1 TABLET(20 MG) BY MOUTH DAILY     Cardiovascular:  Angiotensin Receptor Blockers Passed - 09/16/2023 11:13 AM      Passed - Cr in normal range and within 180 days    Creatinine, Ser  Date Value Ref Range Status  06/16/2023 0.86 0.57 - 1.00 mg/dL Final         Passed - K in normal range and within 180 days    Potassium  Date Value Ref Range Status  06/16/2023 4.4 3.5 - 5.2 mmol/L Final         Passed - Patient is not pregnant      Passed - Last BP in normal range    BP Readings from Last 1 Encounters:  07/23/23 138/75         Passed - Valid encounter within last 6 months    Recent Outpatient Visits           1 month ago Depression, major, single episode, moderate (HCC)   [place] [facility] Aileen Alexanders, NP   3 months ago Annual physical exam    [facility] Aileen Alexanders, NP

## 2023-10-27 ENCOUNTER — Encounter: Payer: Self-pay | Admitting: Nurse Practitioner

## 2023-10-27 ENCOUNTER — Ambulatory Visit: Admitting: Nurse Practitioner

## 2023-10-27 VITALS — BP 125/86 | HR 80 | Temp 98.1°F | Wt 115.8 lb

## 2023-10-27 DIAGNOSIS — F321 Major depressive disorder, single episode, moderate: Secondary | ICD-10-CM | POA: Diagnosis not present

## 2023-10-27 DIAGNOSIS — I10 Essential (primary) hypertension: Secondary | ICD-10-CM | POA: Diagnosis not present

## 2023-10-27 MED ORDER — OLMESARTAN MEDOXOMIL 20 MG PO TABS: 20.0000 mg | ORAL_TABLET | Freq: Every day | ORAL | 1 refills | Status: DC

## 2023-10-27 NOTE — Assessment & Plan Note (Signed)
 Chronic.  Controlled.  Continue with current medication regimen of Olmesartan  daily.  Refills sent today. Return to clinic in 6 months for reevaluation.  Call sooner if concerns arise.

## 2023-10-27 NOTE — Progress Notes (Signed)
 BP 125/86 Comment: Home blood pressure reading  Pulse 80   Temp 98.1 F (36.7 C) (Oral)   Wt 115 lb 12.8 oz (52.5 kg)   SpO2 98%   BMI 24.21 kg/m    Subjective:    Patient ID: Amanda Krause, female    DOB: 07/08/1960, 63 y.o.   MRN: [id number]  HPI: Amanda Krause is a 63 y.o. female  Chief Complaint  Patient presents with   Hypertension   HYPERTENSION Hypertension status: controlled Satisfied with current treatment? no Duration of hypertension: years BP monitoring frequency:  daily BP range: 120-130/80 BP medication side effects:  no Medication compliance: excellent compliance Previous BP meds:none Aspirin: no Recurrent headaches: no Visual changes: no Palpitations: no Dyspnea: no Chest pain: no Lower extremity edema: no Dizzy/lightheaded: no   DEPRESSION Patient states she likes the Celexa .  She feels like her irritability is still bad.  She feels like she would benefit from an increased dose of the medication.   A big source of her stress is her mom.  She would like something to help take her meaness away. She feels like she just needs to get through it right now.  May consider adding something later.      Relevant past medical, surgical, family and social history reviewed and updated as indicated. Interim medical history since our last visit reviewed. Allergies and medications reviewed and updated.  Review of Systems  Eyes:  Negative for visual disturbance.  Respiratory:  Negative for cough, chest tightness and shortness of breath.   Cardiovascular:  Negative for chest pain, palpitations and leg swelling.  Neurological:  Negative for dizziness and headaches.  Psychiatric/Behavioral:  Positive for decreased concentration. Negative for suicidal ideas. The patient is nervous/anxious.     Per HPI unless specifically indicated above     Objective:    BP 125/86 Comment: Home blood pressure reading  Pulse 80   Temp 98.1 F (36.7 C) (Oral)   Wt 115 lb 12.8 oz  (52.5 kg)   SpO2 98%   BMI 24.21 kg/m   Wt Readings from Last 3 Encounters:  10/27/23 115 lb 12.8 oz (52.5 kg)  07/23/23 115 lb 6.4 oz (52.3 kg)  06/16/23 118 lb (53.5 kg)    Physical Exam Vitals and nursing note reviewed.  Constitutional:      General: She is not in acute distress.    Appearance: Normal appearance. She is normal weight. She is not ill-appearing, toxic-appearing or diaphoretic.  HENT:     Head: Normocephalic.     Right Ear: External ear normal.     Left Ear: External ear normal.     Nose: Nose normal.     Mouth/Throat:     Mouth: Mucous membranes are moist.     Pharynx: Oropharynx is clear.  Eyes:     General:        Right eye: No discharge.        Left eye: No discharge.     Extraocular Movements: Extraocular movements intact.     Conjunctiva/sclera: Conjunctivae normal.     Pupils: Pupils are equal, round, and reactive to light.  Cardiovascular:     Rate and Rhythm: Normal rate and regular rhythm.     Heart sounds: No murmur heard. Pulmonary:     Effort: Pulmonary effort is normal. No respiratory distress.     Breath sounds: Normal breath sounds. No wheezing or rales.  Musculoskeletal:     Cervical back: Normal range of motion  and neck supple.  Skin:    General: Skin is warm and dry.     Capillary Refill: Capillary refill takes less than 2 seconds.  Neurological:     General: No focal deficit present.     Mental Status: She is alert and oriented to person, place, and time. Mental status is at baseline.  Psychiatric:        Mood and Affect: Mood normal.        Behavior: Behavior normal.        Thought Content: Thought content normal.        Judgment: Judgment normal.     Results for orders placed or performed in visit on 06/16/23  Urinalysis, Routine w reflex microscopic   Collection Time: 06/16/23  1:51 PM  Result Value Ref Range   Specific Gravity, UA 1.010 1.005 - 1.030   pH, UA 5.5 5.0 - 7.5   Color, UA Yellow Yellow   Appearance Ur  Clear Clear   Leukocytes,UA Negative Negative   Protein,UA Negative Negative/Trace   Glucose, UA Negative Negative   Ketones, UA Trace (A) Negative   RBC, UA Negative Negative   Bilirubin, UA Negative Negative   Urobilinogen, Ur 0.2 0.2 - 1.0 mg/dL   Nitrite, UA Negative Negative  CBC with Differential/Platelet   Collection Time: 06/16/23  1:52 PM  Result Value Ref Range   WBC 6.0 3.4 - 10.8 x10E3/uL   RBC 4.34 3.77 - 5.28 x10E6/uL   Hemoglobin 13.7 11.1 - 15.9 g/dL   Hematocrit 58.7 65.9 - 46.6 %   MCV 95 79 - 97 fL   MCH 31.6 26.6 - 33.0 pg   MCHC 33.3 31.5 - 35.7 g/dL   RDW 87.7 88.2 - 84.5 %   Platelets 176 150 - 450 x10E3/uL   Neutrophils 42 Not Estab. %   Lymphs 49 Not Estab. %   Monocytes 7 Not Estab. %   Eos 1 Not Estab. %   Basos 1 Not Estab. %   Neutrophils Absolute 2.5 1.4 - 7.0 x10E3/uL   Lymphocytes Absolute 2.9 0.7 - 3.1 x10E3/uL   Monocytes Absolute 0.4 0.1 - 0.9 x10E3/uL   EOS (ABSOLUTE) 0.1 0.0 - 0.4 x10E3/uL   Basophils Absolute 0.1 0.0 - 0.2 x10E3/uL   Immature Granulocytes 0 Not Estab. %   Immature Grans (Abs) 0.0 0.0 - 0.1 x10E3/uL  Comprehensive metabolic panel   Collection Time: 06/16/23  1:52 PM  Result Value Ref Range   Glucose 82 70 - 99 mg/dL   BUN 16 8 - 27 mg/dL   Creatinine, Ser 9.13 0.57 - 1.00 mg/dL   eGFR 75 >40 fO/fpw/8.26   BUN/Creatinine Ratio 19 12 - 28   Sodium 139 134 - 144 mmol/L   Potassium 4.4 3.5 - 5.2 mmol/L   Chloride 102 96 - 106 mmol/L   CO2 21 20 - 29 mmol/L   Calcium 10.2 8.7 - 10.3 mg/dL   Total Protein 7.1 6.0 - 8.5 g/dL   Albumin 4.6 3.9 - 4.9 g/dL   Globulin, Total 2.5 1.5 - 4.5 g/dL   Bilirubin Total 0.2 0.0 - 1.2 mg/dL   Alkaline Phosphatase 69 44 - 121 IU/L   AST 33 0 - 40 IU/L   ALT 22 0 - 32 IU/L  Lipid panel   Collection Time: 06/16/23  1:52 PM  Result Value Ref Range   Cholesterol, Total 186 100 - 199 mg/dL   Triglycerides 60 0 - 149 mg/dL   HDL 891 >60 mg/dL  VLDL Cholesterol Cal 11 5 - 40 mg/dL    LDL Chol Calc (NIH) 67 0 - 99 mg/dL   Chol/HDL Ratio 1.7 0.0 - 4.4 ratio  TSH   Collection Time: 06/16/23  1:52 PM  Result Value Ref Range   TSH 1.680 0.450 - 4.500 uIU/mL  Hemoglobin A1c   Collection Time: 06/16/23  1:52 PM  Result Value Ref Range   Hgb A1c MFr Bld 5.2 4.8 - 5.6 %   Est. average glucose Bld gHb Est-mCnc 103 mg/dL      Assessment & Plan:   Problem List Items Addressed This Visit       Cardiovascular and Mediastinum   Primary hypertension   Chronic.  Controlled.  Continue with current medication regimen of Olmesartan  daily.  Refills sent today. Return to clinic in 6 months for reevaluation.  Call sooner if concerns arise.       Relevant Medications   olmesartan  (BENICAR ) 20 MG tablet   Other Relevant Orders   Comp Met (CMET)     Other   Depression, major, single episode, moderate (HCC) - Primary   Chronic. Improved.  Will increase dose of Celexa  to 40mg .  Follow up in 6 months.  Call sooner if concerns arise.         Follow up plan: No follow-ups on file.

## 2023-10-27 NOTE — Assessment & Plan Note (Signed)
 Chronic. Improved.  Will increase dose of Celexa  to 40mg .  Follow up in 6 months.  Call sooner if concerns arise.

## 2023-10-28 ENCOUNTER — Ambulatory Visit: Payer: Self-pay | Admitting: Nurse Practitioner

## 2023-10-28 LAB — COMPREHENSIVE METABOLIC PANEL WITH GFR
ALT: 22 IU/L (ref 0–32)
AST: 27 IU/L (ref 0–40)
Albumin: 4.5 g/dL (ref 3.9–4.9)
Alkaline Phosphatase: 69 IU/L (ref 44–121)
BUN/Creatinine Ratio: 24 (ref 12–28)
BUN: 21 mg/dL (ref 8–27)
Bilirubin Total: 0.4 mg/dL (ref 0.0–1.2)
CO2: 20 mmol/L (ref 20–29)
Calcium: 9.2 mg/dL (ref 8.7–10.3)
Chloride: 102 mmol/L (ref 96–106)
Creatinine, Ser: 0.88 mg/dL (ref 0.57–1.00)
Globulin, Total: 2.7 g/dL (ref 1.5–4.5)
Glucose: 98 mg/dL (ref 70–99)
Potassium: 4.5 mmol/L (ref 3.5–5.2)
Sodium: 138 mmol/L (ref 134–144)
Total Protein: 7.2 g/dL (ref 6.0–8.5)
eGFR: 73 mL/min/1.73 (ref >59)

## 2023-12-12 ENCOUNTER — Other Ambulatory Visit: Payer: Self-pay | Admitting: Nurse Practitioner

## 2023-12-15 NOTE — Telephone Encounter (Signed)
 Too soon for refill, LRF 07/26/23 for 90 and 1 RF. Requested Prescriptions  Pending Prescriptions Disp Refills   citalopram  (CELEXA ) 40 MG tablet [Pharmacy Med Name: CITALOPRAM  40MG  TABLETS] 90 tablet 1    Sig: TAKE 1 TABLET(40 MG) BY MOUTH DAILY     Psychiatry:  Antidepressants - SSRI Passed - 12/15/2023 10:30 AM      Passed - Completed PHQ-2 or PHQ-9 in the last 360 days      Passed - Valid encounter within last 6 months    Recent Outpatient Visits           1 month ago Depression, major, single episode, moderate (HCC)   [place] [facility] Melvin Pao, NP   4 months ago Depression, major, single episode, moderate (HCC)   [place] [facility] Melvin Pao, NP   6 months ago Annual physical exam   [place] [facility] Melvin Pao, NP

## 2024-02-14 ENCOUNTER — Other Ambulatory Visit: Payer: Self-pay | Admitting: Nurse Practitioner

## 2024-02-17 MED ORDER — CITALOPRAM HYDROBROMIDE 40 MG PO TABS: 40.0000 mg | ORAL_TABLET | Freq: Every day | ORAL | 1 refills | Status: DC

## 2024-02-17 NOTE — Telephone Encounter (Signed)
 No longer current dosing of this medication Requested Prescriptions  Pending Prescriptions Disp Refills   citalopram  (CELEXA ) 20 MG tablet [Pharmacy Med Name: CITALOPRAM  20MG  TABLETS] 30 tablet 0    Sig: TAKE 1 TABLET(20 MG) BY MOUTH DAILY     Psychiatry:  Antidepressants - SSRI Passed - 02/17/2024  9:43 AM      Passed - Completed PHQ-2 or PHQ-9 in the last 360 days      Passed - Valid encounter within last 6 months    Recent Outpatient Visits           3 months ago Depression, major, single episode, moderate (HCC)   [facility] Melvin Pao, NP   6 months ago Depression, major, single episode, moderate (HCC)   [place] [facility] Melvin Pao, NP   8 months ago Annual physical exam   [place] [facility] Melvin Pao, NP

## 2024-02-17 NOTE — Telephone Encounter (Signed)
 Routing to provider. Patient last seen in July and has follow up scheduled in January. Last prescription sent in was in April 2025 for #90 with 1 refill.   Copied from CRM [id number]. Topic: Clinical - Prescription Issue >> Feb 17, 2024 10:58 AM Kendralyn S wrote: Reason for CRM: patient asking why citalopram  (CELEXA ) 40 MG tablet was denied at the pharmacy

## 2024-03-14 ENCOUNTER — Other Ambulatory Visit: Payer: Self-pay | Admitting: Nurse Practitioner

## 2024-03-15 NOTE — Telephone Encounter (Signed)
 Requested Prescriptions  Pending Prescriptions Disp Refills   olmesartan  (BENICAR ) 20 MG tablet [Pharmacy Med Name: OLMESARTAN  MEDOXOMIL 20MG  TABLETS] 90 tablet 1    Sig: TAKE 1 TABLET(20 MG) BY MOUTH DAILY     Cardiovascular:  Angiotensin Receptor Blockers Passed - 03/15/2024  4:02 PM      Passed - Cr in normal range and within 180 days    Creatinine, Ser  Date Value Ref Range Status  10/27/2023 0.88 0.57 - 1.00 mg/dL Final         Passed - K in normal range and within 180 days    Potassium  Date Value Ref Range Status  10/27/2023 4.5 3.5 - 5.2 mmol/L Final         Passed - Patient is not pregnant      Passed - Last BP in normal range    BP Readings from Last 1 Encounters:  10/27/23 125/86         Passed - Valid encounter within last 6 months    Recent Outpatient Visits           4 months ago Depression, major, single episode, moderate (HCC)   [place] [facility] Melvin Pao, NP   7 months ago Depression, major, single episode, moderate (HCC)   [place] [facility] Melvin Pao, NP   9 months ago Annual physical exam   [place] [facility] Melvin Pao, NP

## 2024-04-29 ENCOUNTER — Encounter: Payer: Self-pay | Admitting: Nurse Practitioner

## 2024-04-29 ENCOUNTER — Ambulatory Visit (INDEPENDENT_AMBULATORY_CARE_PROVIDER_SITE_OTHER): Admitting: Nurse Practitioner

## 2024-04-29 VITALS — BP 127/84 | HR 79 | Temp 98.0°F | Ht <= 58 in | Wt 125.0 lb

## 2024-04-29 DIAGNOSIS — Z23 Encounter for immunization: Secondary | ICD-10-CM | POA: Diagnosis not present

## 2024-04-29 DIAGNOSIS — F321 Major depressive disorder, single episode, moderate: Secondary | ICD-10-CM | POA: Diagnosis not present

## 2024-04-29 DIAGNOSIS — I10 Essential (primary) hypertension: Secondary | ICD-10-CM

## 2024-04-29 MED ORDER — NICOTINE 21 MG/24HR TD PT24: 21.0000 mg | MEDICATED_PATCH | Freq: Every day | TRANSDERMAL | 1 refills | Status: AC

## 2024-04-29 MED ORDER — VALACYCLOVIR HCL 1 G PO TABS: 1000.0000 mg | ORAL_TABLET | Freq: Two times a day (BID) | ORAL | 1 refills | Status: AC

## 2024-04-29 MED ORDER — OLMESARTAN MEDOXOMIL 20 MG PO TABS: 20.0000 mg | ORAL_TABLET | Freq: Every day | ORAL | 1 refills | Status: AC

## 2024-04-29 MED ORDER — CITALOPRAM HYDROBROMIDE 40 MG PO TABS: 40.0000 mg | ORAL_TABLET | Freq: Every day | ORAL | 1 refills | Status: AC

## 2024-04-29 NOTE — Progress Notes (Signed)
 "  BP 127/84 Comment: Home blood pressure reading  Pulse 79   Temp 98 F (36.7 C) (Oral)   Ht 4' 9.99 (1.473 m)   Wt 125 lb (56.7 kg)   SpO2 99%   BMI 26.13 kg/m    Subjective:    Patient ID: Amanda Krause, female    DOB: 1961-03-02, 64 y.o.   MRN: [id number]  HPI: Amanda Krause is a 64 y.o. female  Chief Complaint  Patient presents with   office visit    6 month F/u   HYPERTENSION Patient taking Olmesartan  and tolerating it well.  Hypertension status: controlled Satisfied with current treatment? no Duration of hypertension: years BP monitoring frequency:  daily BP range:127/84 BP medication side effects:  no Medication compliance: excellent compliance Previous BP meds:none Aspirin: no Recurrent headaches: no Visual changes: no Palpitations: no Dyspnea: no Chest pain: no Lower extremity edema: no Dizzy/lightheaded: no   DEPRESSION Patient states she likes the Celexa .  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  She is struggling with what is going on in the world.  Otherwise, she feels like she is doing well.  Denies SI.      Relevant past medical, surgical, family and social history reviewed and updated as indicated. Interim medical history since our last visit reviewed. Allergies and medications reviewed and updated.  Review of Systems  Eyes:  Negative for visual disturbance.  Respiratory:  Negative for cough, chest tightness and shortness of breath.   Cardiovascular:  Negative for chest pain, palpitations and leg swelling.  Neurological:  Negative for dizziness and headaches.  Psychiatric/Behavioral:  Positive for decreased concentration. Negative for suicidal ideas. The patient is nervous/anxious.     Per HPI unless specifically indicated above     Objective:    BP 127/84 Comment: Home blood pressure reading  Pulse 79   Temp 98 F (36.7 C) (Oral)   Ht 4' 9.99 (1.473 m)   Wt 125 lb (56.7 kg)   SpO2 99%   BMI 26.13 kg/m   Wt  Readings from Last 3 Encounters:  04/29/24 125 lb (56.7 kg)  10/27/23 115 lb 12.8 oz (52.5 kg)  07/23/23 115 lb 6.4 oz (52.3 kg)    Physical Exam Vitals and nursing note reviewed.  Constitutional:      General: She is not in acute distress.    Appearance: Normal appearance. She is normal weight. She is not ill-appearing, toxic-appearing or diaphoretic.  HENT:     Head: Normocephalic.     Right Ear: External ear normal.     Left Ear: External ear normal.     Nose: Nose normal.     Mouth/Throat:     Mouth: Mucous membranes are moist.     Pharynx: Oropharynx is clear.  Eyes:     General:        Right eye: No discharge.        Left eye: No discharge.     Extraocular Movements: Extraocular movements intact.     Conjunctiva/sclera: Conjunctivae normal.     Pupils: Pupils are equal, round, and reactive to light.  Cardiovascular:     Rate and Rhythm: Normal rate and regular rhythm.     Heart sounds: No murmur heard. Pulmonary:     Effort: Pulmonary effort is normal. No respiratory distress.     Breath sounds: Normal breath sounds. No wheezing or rales.  Musculoskeletal:     Cervical back: Normal range of motion and  neck supple.  Skin:    General: Skin is warm and dry.     Capillary Refill: Capillary refill takes less than 2 seconds.  Neurological:     General: No focal deficit present.     Mental Status: She is alert and oriented to person, place, and time. Mental status is at baseline.  Psychiatric:        Mood and Affect: Mood normal.        Behavior: Behavior normal.        Thought Content: Thought content normal.        Judgment: Judgment normal.     Results for orders placed or performed in visit on 10/27/23  Comp Met (CMET)   Collection Time: 10/27/23  2:10 PM  Result Value Ref Range   Glucose 98 70 - 99 mg/dL   BUN 21 8 - 27 mg/dL   Creatinine, Ser 9.11 0.57 - 1.00 mg/dL   eGFR 73 >40 fO/fpw/8.26   BUN/Creatinine Ratio 24 12 - 28   Sodium 138 134 - 144 mmol/L    Potassium 4.5 3.5 - 5.2 mmol/L   Chloride 102 96 - 106 mmol/L   CO2 20 20 - 29 mmol/L   Calcium 9.2 8.7 - 10.3 mg/dL   Total Protein 7.2 6.0 - 8.5 g/dL   Albumin 4.5 3.9 - 4.9 g/dL   Globulin, Total 2.7 1.5 - 4.5 g/dL   Bilirubin Total 0.4 0.0 - 1.2 mg/dL   Alkaline Phosphatase 69 44 - 121 IU/L   AST 27 0 - 40 IU/L   ALT 22 0 - 32 IU/L      Assessment & Plan:   Problem List Items Addressed This Visit       Cardiovascular and Mediastinum   Primary hypertension   Chronic.  Controlled.  Continue with current medication regimen of Olmesartan  daily.  Continue to check blood pressure at home and bring log to next visit.  Elevated at visit but well controlled at home regularly.  Refills sent today. Return to clinic in 6 months for reevaluation.  Call sooner if concerns arise.       Relevant Medications   olmesartan  (BENICAR ) 20 MG tablet   Other Relevant Orders   Comprehensive metabolic panel with GFR     Other   Depression, major, single episode, moderate (HCC) - Primary   Chronic.  Controlled.  Continue with current medication regimen of Citalopram  daily.  Refills sent today.  Labs ordered today.  Return to clinic in 6 months for reevaluation.  Call sooner if concerns arise.        Relevant Medications   citalopram  (CELEXA ) 40 MG tablet   Other Visit Diagnoses       Flu vaccine need       Relevant Orders   Flu vaccine HIGH DOSE PF(Fluzone Trivalent) (Completed)        Follow up plan: Return in about 6 months (around 10/27/2024) for Welcome to Medicare (40 min), Physical and Fasting labs.      "

## 2024-04-29 NOTE — Assessment & Plan Note (Signed)
 Chronic.  Controlled.  Continue with current medication regimen of Olmesartan  daily.  Continue to check blood pressure at home and bring log to next visit.  Elevated at visit but well controlled at home regularly.  Refills sent today. Return to clinic in 6 months for reevaluation.  Call sooner if concerns arise.

## 2024-04-29 NOTE — Assessment & Plan Note (Signed)
 Chronic.  Controlled.  Continue with current medication regimen of Citalopram  daily.  Refills sent today.  Labs ordered today.  Return to clinic in 6 months for reevaluation.  Call sooner if concerns arise.

## 2024-04-30 ENCOUNTER — Ambulatory Visit: Payer: Self-pay | Admitting: Nurse Practitioner

## 2024-04-30 LAB — COMPREHENSIVE METABOLIC PANEL WITH GFR
ALT: 25 IU/L (ref 0–32)
AST: 26 IU/L (ref 0–40)
Albumin: 4.4 g/dL (ref 3.9–4.9)
Alkaline Phosphatase: 56 IU/L (ref 49–135)
BUN/Creatinine Ratio: 32 — ABNORMAL HIGH (ref 12–28)
BUN: 30 mg/dL — ABNORMAL HIGH (ref 8–27)
Bilirubin Total: 0.3 mg/dL (ref 0.0–1.2)
CO2: 22 mmol/L (ref 20–29)
Calcium: 9.2 mg/dL (ref 8.7–10.3)
Chloride: 101 mmol/L (ref 96–106)
Creatinine, Ser: 0.94 mg/dL (ref 0.57–1.00)
Globulin, Total: 2.6 g/dL (ref 1.5–4.5)
Glucose: 90 mg/dL (ref 70–99)
Potassium: 4.8 mmol/L (ref 3.5–5.2)
Sodium: 137 mmol/L (ref 134–144)
Total Protein: 7 g/dL (ref 6.0–8.5)
eGFR: 67 mL/min/1.73

## 2024-10-29 ENCOUNTER — Encounter: Admitting: Nurse Practitioner
